# Patient Record
Sex: Male | Born: 1985
Health system: Southern US, Community
[De-identification: ages and names within clinical notes are randomized; demographics above are authoritative.]

## PROBLEM LIST (undated history)

## (undated) DIAGNOSIS — N46023 Azoospermia due to obstruction of efferent ducts: Secondary | ICD-10-CM

## (undated) DIAGNOSIS — I10 Essential (primary) hypertension: Secondary | ICD-10-CM

## (undated) DIAGNOSIS — K5090 Crohn's disease, unspecified, without complications: Secondary | ICD-10-CM

## (undated) DIAGNOSIS — R7989 Other specified abnormal findings of blood chemistry: Secondary | ICD-10-CM

## (undated) HISTORY — PX: CLUB FOOT RELEASE: SHX1363

## (undated) HISTORY — PX: UPPER GASTROINTESTINAL ENDOSCOPY: SHX188

## (undated) HISTORY — PX: COLONOSCOPY: SHX174

---
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
## 2006-10-09 ENCOUNTER — Emergency Department (HOSPITAL_COMMUNITY): Admission: EM | Admit: 2006-10-09 | Discharge: 2006-10-09 | Payer: Self-pay | Admitting: Emergency Medicine

## 2014-12-21 ENCOUNTER — Other Ambulatory Visit: Payer: Self-pay | Admitting: Urology

## 2014-12-25 ENCOUNTER — Other Ambulatory Visit: Payer: Self-pay | Admitting: Urology

## 2015-01-22 ENCOUNTER — Encounter (HOSPITAL_BASED_OUTPATIENT_CLINIC_OR_DEPARTMENT_OTHER): Payer: Self-pay | Admitting: *Deleted

## 2015-01-24 ENCOUNTER — Encounter (HOSPITAL_BASED_OUTPATIENT_CLINIC_OR_DEPARTMENT_OTHER): Payer: Self-pay | Admitting: *Deleted

## 2015-01-24 NOTE — Progress Notes (Signed)
NPO AFTER MN.  ARRIVE AT 0900.  GETTING LAB WORK DONE FRI 01-25-2015 (HgB, Hct, Testosterone level).  WILL TAKE AM MEDS W/ SIPS OF WATER DOS.

## 2015-01-28 ENCOUNTER — Ambulatory Visit (HOSPITAL_BASED_OUTPATIENT_CLINIC_OR_DEPARTMENT_OTHER): Payer: BLUE CROSS/BLUE SHIELD | Admitting: Anesthesiology

## 2015-01-28 ENCOUNTER — Encounter (HOSPITAL_BASED_OUTPATIENT_CLINIC_OR_DEPARTMENT_OTHER): Payer: Self-pay | Admitting: Anesthesiology

## 2015-01-28 ENCOUNTER — Encounter (HOSPITAL_BASED_OUTPATIENT_CLINIC_OR_DEPARTMENT_OTHER)
Admission: RE | Disposition: A | Discharge status: Home / Self Care | Payer: Self-pay | Source: Ambulatory Visit | Attending: Urology

## 2015-01-28 ENCOUNTER — Ambulatory Visit (HOSPITAL_BASED_OUTPATIENT_CLINIC_OR_DEPARTMENT_OTHER)
Admission: RE | Admit: 2015-01-28 | Discharge: 2015-01-28 | Disposition: A | Discharge status: Home / Self Care | Payer: BLUE CROSS/BLUE SHIELD | Source: Ambulatory Visit | Attending: Urology | Admitting: Urology

## 2015-01-28 DIAGNOSIS — N4601 Organic azoospermia: Secondary | ICD-10-CM | POA: Insufficient documentation

## 2015-01-28 DIAGNOSIS — E669 Obesity, unspecified: Secondary | ICD-10-CM | POA: Insufficient documentation

## 2015-01-28 DIAGNOSIS — E291 Testicular hypofunction: Secondary | ICD-10-CM | POA: Insufficient documentation

## 2015-01-28 DIAGNOSIS — N500 Atrophy of testis: Secondary | ICD-10-CM | POA: Insufficient documentation

## 2015-01-28 DIAGNOSIS — I861 Scrotal varices: Secondary | ICD-10-CM | POA: Diagnosis not present

## 2015-01-28 DIAGNOSIS — Z6830 Body mass index (BMI) 30.0-30.9, adult: Secondary | ICD-10-CM | POA: Insufficient documentation

## 2015-01-28 HISTORY — DX: Azoospermia due to obstruction of efferent ducts: N46.023

## 2015-01-28 HISTORY — PX: TESTICLE BIOPSY: SHX471

## 2015-01-28 LAB — POCT I-STAT 4, (NA,K, GLUC, HGB,HCT)
Glucose, Bld: 94 mg/dL (ref 65–99)
HCT: 43 % (ref 39.0–52.0)
Hemoglobin: 14.6 g/dL (ref 13.0–17.0)
Potassium: 4.4 mmol/L (ref 3.5–5.1)
Sodium: 140 mmol/L (ref 135–145)

## 2015-01-28 LAB — HEMOGLOBIN AND HEMATOCRIT, BLOOD
HCT: 41.3 % (ref 39.0–52.0)
Hemoglobin: 14.1 g/dL (ref 13.0–17.0)

## 2015-01-28 SURGERY — BIOPSY, TESTICLE
Anesthesia: General | Site: Scrotum | Laterality: Bilateral

## 2015-01-28 MED ORDER — ONDANSETRON HCL 4 MG/2ML IJ SOLN
INTRAMUSCULAR | Status: DC | PRN
  Administered 2015-01-28: 4 mg via INTRAVENOUS

## 2015-01-28 MED ORDER — LIDOCAINE HCL (CARDIAC) 20 MG/ML IV SOLN
INTRAVENOUS | Status: AC
Start: 2015-01-28 — End: 2015-01-28
  Filled 2015-01-28: qty 5

## 2015-01-28 MED ORDER — KETOROLAC TROMETHAMINE 30 MG/ML IJ SOLN
30.0000 mg | Freq: Once | INTRAMUSCULAR | Status: AC
  Administered 2015-01-28: 30 mg via INTRAVENOUS
  Filled 2015-01-28: qty 1

## 2015-01-28 MED ORDER — PROPOFOL 10 MG/ML IV BOLUS
INTRAVENOUS | Status: AC
  Filled 2015-01-28: qty 20

## 2015-01-28 MED ORDER — FENTANYL CITRATE (PF) 100 MCG/2ML IJ SOLN
25.0000 ug | INTRAMUSCULAR | Status: DC | PRN
Start: 2015-01-28 — End: 2015-01-28
  Administered 2015-01-28 (×2): 25 ug via INTRAVENOUS
  Filled 2015-01-28: qty 1

## 2015-01-28 MED ORDER — CIPROFLOXACIN IN D5W 400 MG/200ML IV SOLN
INTRAVENOUS | Status: AC
  Filled 2015-01-28: qty 200

## 2015-01-28 MED ORDER — PROPOFOL 10 MG/ML IV BOLUS
INTRAVENOUS | Status: DC | PRN
  Administered 2015-01-28: 200 mg via INTRAVENOUS

## 2015-01-28 MED ORDER — MIDAZOLAM HCL 2 MG/2ML IJ SOLN
INTRAMUSCULAR | Status: AC
  Filled 2015-01-28: qty 2

## 2015-01-28 MED ORDER — DEXAMETHASONE SODIUM PHOSPHATE 10 MG/ML IJ SOLN
INTRAMUSCULAR | Status: AC
  Filled 2015-01-28: qty 1

## 2015-01-28 MED ORDER — FENTANYL CITRATE (PF) 100 MCG/2ML IJ SOLN
INTRAMUSCULAR | Status: DC | PRN
  Administered 2015-01-28 (×2): 50 ug via INTRAVENOUS

## 2015-01-28 MED ORDER — OXYCODONE-ACETAMINOPHEN 5-325 MG PO TABS
1.0000 | ORAL_TABLET | ORAL | Status: DC | PRN
Start: 2015-01-28 — End: 2015-09-17

## 2015-01-28 MED ORDER — ONDANSETRON HCL 4 MG/2ML IJ SOLN
INTRAMUSCULAR | Status: AC
  Filled 2015-01-28: qty 2

## 2015-01-28 MED ORDER — LACTATED RINGERS IV SOLN
INTRAVENOUS | Status: DC
  Administered 2015-01-28 (×2): via INTRAVENOUS
  Filled 2015-01-28: qty 1000

## 2015-01-28 MED ORDER — SODIUM CHLORIDE 0.9 % IR SOLN
Status: DC | PRN
  Administered 2015-01-28: 500 mL

## 2015-01-28 MED ORDER — KETOROLAC TROMETHAMINE 30 MG/ML IJ SOLN
INTRAMUSCULAR | Status: AC
  Filled 2015-01-28: qty 1

## 2015-01-28 MED ORDER — FENTANYL CITRATE (PF) 100 MCG/2ML IJ SOLN
INTRAMUSCULAR | Status: AC
  Filled 2015-01-28: qty 2

## 2015-01-28 MED ORDER — CIPROFLOXACIN HCL 500 MG PO TABS: 500.0000 mg | ORAL_TABLET | Freq: Two times a day (BID) | ORAL | Status: DC

## 2015-01-28 MED ORDER — LIDOCAINE HCL (CARDIAC) 20 MG/ML IV SOLN
INTRAVENOUS | Status: DC | PRN
  Administered 2015-01-28: 80 mg via INTRAVENOUS

## 2015-01-28 MED ORDER — ACETAMINOPHEN 10 MG/ML IV SOLN
INTRAVENOUS | Status: AC
  Filled 2015-01-28: qty 100

## 2015-01-28 MED ORDER — ACETAMINOPHEN 10 MG/ML IV SOLN
1000.0000 mg | Freq: Once | INTRAVENOUS | Status: AC
  Administered 2015-01-28: 1000 mg via INTRAVENOUS
  Filled 2015-01-28: qty 100

## 2015-01-28 MED ORDER — OXYCODONE HCL 5 MG PO TABS
ORAL_TABLET | ORAL | Status: AC
  Filled 2015-01-28: qty 1

## 2015-01-28 MED ORDER — OXYCODONE HCL 5 MG PO TABS
5.0000 mg | ORAL_TABLET | Freq: Once | ORAL | Status: AC
  Administered 2015-01-28: 5 mg via ORAL
  Filled 2015-01-28: qty 1

## 2015-01-28 MED ORDER — MIDAZOLAM HCL 5 MG/5ML IJ SOLN
INTRAMUSCULAR | Status: DC | PRN
  Administered 2015-01-28: 2 mg via INTRAVENOUS

## 2015-01-28 MED ORDER — BUPIVACAINE HCL 0.25 % IJ SOLN
INTRAMUSCULAR | Status: DC | PRN
  Administered 2015-01-28: 6 mL

## 2015-01-28 MED ORDER — BUPIVACAINE HCL (PF) 0.25 % IJ SOLN
INTRAMUSCULAR | Status: AC
  Filled 2015-01-28: qty 30

## 2015-01-28 MED ORDER — DEXAMETHASONE SODIUM PHOSPHATE 4 MG/ML IJ SOLN
INTRAMUSCULAR | Status: DC | PRN
  Administered 2015-01-28: 10 mg via INTRAVENOUS

## 2015-01-28 MED ORDER — PROMETHAZINE HCL 25 MG/ML IJ SOLN
6.2500 mg | INTRAMUSCULAR | Status: DC | PRN
Start: 2015-01-28 — End: 2015-01-28
  Filled 2015-01-28: qty 1

## 2015-01-28 MED ORDER — CIPROFLOXACIN IN D5W 400 MG/200ML IV SOLN
400.0000 mg | INTRAVENOUS | Status: AC
  Administered 2015-01-28: 400 mg via INTRAVENOUS
  Filled 2015-01-28: qty 200

## 2015-01-28 SURGICAL SUPPLY — 36 items
BANDAGE CO FLEX L/F 2IN X 5YD (GAUZE/BANDAGES/DRESSINGS) ×2 IMPLANT
BLADE CLIPPER SURG (BLADE) IMPLANT
BLADE SURG 15 STRL LF DISP TIS (BLADE) ×1 IMPLANT
BLADE SURG 15 STRL SS (BLADE) ×2
BNDG GAUZE ELAST 4 BULKY (GAUZE/BANDAGES/DRESSINGS) ×1 IMPLANT
CLOTH BEACON ORANGE TIMEOUT ST (SAFETY) ×2 IMPLANT
COVER BACK TABLE 60X90IN (DRAPES) ×2 IMPLANT
COVER MAYO STAND STRL (DRAPES) ×2 IMPLANT
DRAPE PED LAPAROTOMY (DRAPES) ×2 IMPLANT
ELECT NDL TIP 2.8 STRL (NEEDLE) ×1 IMPLANT
ELECT NEEDLE TIP 2.8 STRL (NEEDLE) ×2 IMPLANT
ELECT REM PT RETURN 9FT ADLT (ELECTROSURGICAL) ×2
ELECTRODE REM PT RTRN 9FT ADLT (ELECTROSURGICAL) ×1 IMPLANT
GLOVE BIO SURGEON STRL SZ 6.5 (GLOVE) ×1 IMPLANT
GLOVE BIO SURGEON STRL SZ7.5 (GLOVE) ×2 IMPLANT
GLOVE INDICATOR 6.5 STRL GRN (GLOVE) ×1 IMPLANT
GLOVE INDICATOR 7.5 STRL GRN (GLOVE) ×1 IMPLANT
GOWN STRL REUS W/ TWL LRG LVL3 (GOWN DISPOSABLE) ×1 IMPLANT
GOWN STRL REUS W/ TWL XL LVL3 (GOWN DISPOSABLE) ×1 IMPLANT
GOWN STRL REUS W/TWL LRG LVL3 (GOWN DISPOSABLE) ×4
GOWN STRL REUS W/TWL XL LVL3 (GOWN DISPOSABLE) ×2
KIT ROOM TURNOVER WOR (KITS) ×2 IMPLANT
LIQUID BAND (GAUZE/BANDAGES/DRESSINGS) ×1 IMPLANT
MANIFOLD NEPTUNE II (INSTRUMENTS) IMPLANT
NDL HYPO 25X5/8 SAFETYGLIDE (NEEDLE) ×1 IMPLANT
NEEDLE HYPO 25X5/8 SAFETYGLIDE (NEEDLE) ×2 IMPLANT
NS IRRIG 500ML POUR BTL (IV SOLUTION) ×2 IMPLANT
PACK BASIN DAY SURGERY FS (CUSTOM PROCEDURE TRAY) ×2 IMPLANT
PENCIL BUTTON HOLSTER BLD 10FT (ELECTRODE) ×2 IMPLANT
SPONGE GAUZE 4X4 12PLY STER LF (GAUZE/BANDAGES/DRESSINGS) ×1 IMPLANT
SUT MON AB 4-0 PC3 18 (SUTURE) ×7 IMPLANT
SYR CONTROL 10ML LL (SYRINGE) ×2 IMPLANT
TOWEL OR 17X24 6PK STRL BLUE (TOWEL DISPOSABLE) ×4 IMPLANT
TRAY DSU PREP LF (CUSTOM PROCEDURE TRAY) ×2 IMPLANT
TUBE CONNECTING 12X1/4 (SUCTIONS) IMPLANT
WATER STERILE IRR 500ML POUR (IV SOLUTION) ×2 IMPLANT

## 2015-01-28 NOTE — Anesthesia Preprocedure Evaluation (Addendum)
Anesthesia Evaluation  Patient identified by MRN, date of birth, ID band Patient awake    Reviewed: Allergy & Precautions, NPO status , Patient's Chart, lab work & pertinent test results  Airway Mallampati: II  TM Distance: >3 FB Neck ROM: Full    Dental no notable dental hx.    Pulmonary neg pulmonary ROS,    Pulmonary exam normal breath sounds clear to auscultation       Cardiovascular negative cardio ROS Normal cardiovascular exam Rhythm:Regular Rate:Normal     Neuro/Psych negative neurological ROS  negative psych ROS   GI/Hepatic negative GI ROS, Neg liver ROS,   Endo/Other  negative endocrine ROS  Renal/GU negative Renal ROS  negative genitourinary   Musculoskeletal negative musculoskeletal ROS (+)   Abdominal (+) + obese,   Peds negative pediatric ROS (+)  Hematology negative hematology ROS (+)   Anesthesia Other Findings   Reproductive/Obstetrics negative OB ROS                             Anesthesia Physical Anesthesia Plan  ASA: II  Anesthesia Plan: General   Post-op Pain Management:    Induction: Intravenous  Airway Management Planned: LMA  Additional Equipment:   Intra-op Plan:   Post-operative Plan: Extubation in OR  Informed Consent: I have reviewed the patients History and Physical, chart, labs and discussed the procedure including the risks, benefits and alternatives for the proposed anesthesia with the patient or authorized representative who has indicated his/her understanding and acceptance.   Dental advisory given  Plan Discussed with: CRNA  Anesthesia Plan Comments:         Anesthesia Quick Evaluation  

## 2015-01-28 NOTE — Anesthesia Postprocedure Evaluation (Signed)
Anesthesia Post Note  Patient: Christopher Nguyen  Procedure(s) Performed: Procedure(s) (LRB): BIOPSY TESTICULAR (Bilateral)  Patient location during evaluation: PACU Anesthesia Type: General Level of consciousness: awake and alert Pain management: pain level controlled Vital Signs Assessment: post-procedure vital signs reviewed and stable Respiratory status: spontaneous breathing, nonlabored ventilation, respiratory function stable and patient connected to nasal cannula oxygen Cardiovascular status: blood pressure returned to baseline and stable Postop Assessment: no signs of nausea or vomiting Anesthetic complications: no    Last Vitals:  Filed Vitals:   01/28/15 1245 01/28/15 1300  BP: 144/108 152/103  Pulse: 80 94  Temp:    Resp: 12 15    Last Pain:  Filed Vitals:   01/28/15 1402  PainSc: 3                  Beth Goodlin J

## 2015-01-28 NOTE — Discharge Instructions (Addendum)
Infertility Infertility is when you are unable to get pregnant (conceive) after a year of having sex regularly without using birth control. Infertility can also mean that a woman is not able to carry a pregnancy to full term.  Both women and men can have fertility problems. WHAT CAUSES INFERTILITY? What Causes Infertility in Women? There are many possible causes of infertility in women. For some women, the cause of infertility is not known (unexplained infertility). Infertility can also be linked to more than one cause. Infertility problems in women can be caused by problems with the menstrual cycle or reproductive organs, certain medical conditions, and factors related to lifestyle and age.  Problems with your menstrual cycle can interfere with your ovaries producing eggs (ovulation). This can make it difficult to get pregnant. This includes having a menstrual cycle that is very long, very short, or irregular.  Problems with reproductive organs can include:  An abnormally narrow cervix or a cervix that does not remain closed during a pregnancy.  A blockage in your fallopian tubes.  An abnormally shaped uterus.  Uterine fibroids. This is a tissue mass (tumor) that can develop on your uterus.  Medical conditions that can affect a woman's fertility include:  Polycystic ovarian syndrome (PCOS). This is a hormonal disorder that can cause small cysts to grow on your ovaries. This is the most common cause of infertility in women.  Endometriosis. This is a condition in which the tissue that lines your uterus (endometrium) grows outside of its normal location.  Primary ovary insufficiency. This is when your ovaries stop producing eggs and hormones before the age of 72.  Sexually transmitted diseases, such as chlamydia or gonorrhea. These infections can cause scarring in your fallopian tubes. This makes it difficult for eggs to reach your uterus.  Autoimmune disorders. These are disorders in  which your immune system attacks normal, healthy cells.  Hormone imbalances.  Other factors include:  Age. A woman's fertility declines with age, especially after her mid-68s.  Being under- or overweight.  Drinking too much alcohol.  Using drugs.  Exercising excessively.  Being exposed to environmental toxins, such as radiation, pesticides, and certain chemicals. What Causes Infertility in Men? There are many causes of infertility in men. Infertility can be linked to more than one cause. Infertility problems in men can be caused by problems with sperm or the reproductive organs, certain medical conditions, and factors related to lifestyle and age. Some men have unexplained infertility.   Problems with sperm. Infertility can result if there is a problem producing:  Enough sperm (low sperm count).  Enough normally-shaped sperm (sperm morphology).  Sperm that are able to reach the egg (poor motility).  Infertility can also be caused by:  A problem with hormones.  Enlarged veins (varicoceles), cysts (spermatoceles), or tumors of the testicles.  Sexual dysfunction.  Injury to the testicles.  A birth defect, such as not having the tubes that carry sperm (vas deferens).  Medical conditions that can affect a man's fertility include:  Diabetes.  Cancer treatments, such as chemotherapy or radiation.  Klinefelter syndrome. This is an inherited genetic disorder.   Thyroid problems, such as an under- or overactive thyroid.  Cystic fibrosis.  Sexually transmitted diseases.  Other factors include:  Age. A man's fertility declines with age.  Drinking too much alcohol.  Using drugs.  Being exposed to environmental toxins, such as pesticides and lead. WHAT ARE THE SYMPTOMS OF INFERTILITY? Being unable to get pregnant after one year of having  regular sex without using birth control is the only sign of infertility.  HOW IS INFERTILITY DIAGNOSED? In order to be diagnosed  with infertility, both partners will have a physical exam. Both partners will also have an extensive medical and sexual history taken. If there is no obvious reason for infertility, additional tests may be done. What Tests Will Women Have? Women may first have tests to check whether they are ovulating each month. The tests may include:  Blood tests to check hormone levels.  An ultrasound of the ovaries. This looks for possible problems on or in the ovaries.  Taking a small sample of the tissue that lines the uterus for examination under a microscope (endometrial biopsy). Women who are ovulating may have additional tests. These may include:  Hysterosalpingography.  This is an X-ray of the fallopian tubes and uterus taken after a specific type of dye is injected.  This test can show the shape of the uterus and whether the fallopian tubes are open.  Laparoscopy.  In this test, a lighted tube (laparoscope) is used to look for problems in the fallopian tubes and other male organs.  Transvaginal ultrasound.  This is an imaging test to check for abnormalities of the uterus and ovaries.  A health care provider can use this test to count the number of follicles on the ovaries.  Hysteroscopy.  This test involves using a lighted tube to examine the cervix and inside the uterus.  It is done to find any abnormalities inside the uterus. What Tests Will Men Have? Tests for men's infertility includes:  Semen tests to check sperm count, morphology, and motility.  Blood tests to check for hormone levels.  Taking a small sample of tissue from inside a testicle (biopsy). This is examined under a microscope.  Blood tests to check for genetic abnormalities (genetic testing). HOW ARE WOMEN TREATED FOR INFERTILITY?  Treatment depends on the cause of infertility. Most cases of infertility in women are treated with medicine or surgery.  Women may take medicine to:  Correct ovulation  problems.  Treat other health conditions, such as PCOS.  Surgery may be done to:  Repair damage to the ovaries, fallopian tubes, cervix, or uterus.  Remove growths from the uterus.  Remove scar tissue from the uterus, pelvis, or other male organs. HOW ARE MEN TREATED FOR INFERTILITY?  Treatment depends on the cause of infertility. Most cases of infertility in men are treated with medicine or surgery.   Men may take medicine to:  Correct hormone problems.  Treat other health conditions.  Treat sexual dysfunction.  Surgery may be done to:  Remove blockages in the reproductive tract.  Correct other structural problems of the reproductive tract. WHAT IS ASSISTED REPRODUCTIVE TECHNOLOGY? Assisted reproductive technology (ART) refers to all treatments and procedures that combine eggs and sperm outside the body to try to help a couple conceive. ART is often combined with fertility drugs to stimulate ovulation. Sometimes ART is done using eggs retrieved from another woman's body (donor eggs) or from previously frozen fertilized eggs (embryos).  There are different types of ART. These include:   Intrauterine insemination (IUI).  In this procedure, sperm is placed directly into a woman's uterus with a long, thin tube.  This may be most effective for infertility caused by sperm problems, including low sperm count and low motility.  Can be used in combination with fertility drugs.  In vitro fertilization (IVF).  This is often done when a woman's fallopian tubes are blocked  or when a man has low sperm counts.  Fertility drugs stimulate the ovaries to produce multiple eggs. Once mature, these eggs are removed from the body and combined with the sperm to be fertilized.  These fertilized eggs are then placed in the woman's uterus.   This information is not intended to replace advice given to you by your health care provider. Make sure you discuss any questions you have with your  health care provider.     HOME CARE INSTRUCTIONS FOR SCROTAL PROCEDURES  Wound Care & Hygiene: You may apply an ice bag to the scrotum for the first 24 hours.  This may help decrease swelling and soreness.  You may have a dressing held in place by an athletic supporter.  You may remove the dressing in 24 hours and shower in 48 hours.  Continue to use the athletic supporter or tight briefs for at least a week. Activity: Rest today - not necessarily flat bed rest.  Just take it easy.  You should not do strenuous activities until your follow-up visit with your doctor.  You may resume light activity in 48 hours.  Return to Work:  Your doctor will advise you of this depending on the type of work you do  Diet: Drink liquids or eat a light diet this evening.  You may resume a regular diet tomorrow.  General Expectations: You may have a small amount of bleeding.  The scrotum may be swollen or bruised for about a week.  Call your Doctor if these occur:  -persistent or heavy bleeding  -temperature of 101 degrees or more  -severe pain, not relieved by your pain medication  Return to [employer]:  Call to set up and appointment.    Post Anesthesia Home Care Instructions  Activity: Get plenty of rest for the remainder of the day. A responsible adult should stay with you for 24 hours following the procedure.  For the next 24 hours, DO NOT: -Drive a car -Paediatric nurse -Drink alcoholic beverages -Take any medication unless instructed by your physician -Make any legal decisions or sign important papers.  Meals: Start with liquid foods such as gelatin or soup. Progress to regular foods as tolerated. Avoid greasy, spicy, heavy foods. If nausea and/or vomiting occur, drink only clear liquids until the nausea and/or vomiting subsides. Call your physician if vomiting continues.  Special Instructions/Symptoms: Your throat may feel dry or sore from the anesthesia or the breathing tube  placed in your throat during surgery. If this causes discomfort, gargle with warm salt water. The discomfort should disappear within 24 hours.  If you had a scopolamine patch placed behind your ear for the management of post- operative nausea and/or vomiting:  1. The medication in the patch is effective for 72 hours, after which it should be removed.  Wrap patch in a tissue and discard in the trash. Wash hands thoroughly with soap and water. 2. You may remove the patch earlier than 72 hours if you experience unpleasant side effects which may include dry mouth, dizziness or visual disturbances. 3. Avoid touching the patch. Wash your hands with soap and water after contact with the patch.

## 2015-01-28 NOTE — Anesthesia Procedure Notes (Signed)
Procedure Name: LMA Insertion Date/Time: 01/28/2015 11:22 AM Performed by: Bethena Roys T Pre-anesthesia Checklist: Patient identified, Emergency Drugs available, Suction available and Patient being monitored Patient Re-evaluated:Patient Re-evaluated prior to inductionOxygen Delivery Method: Circle System Utilized Preoxygenation: Pre-oxygenation with 100% oxygen Intubation Type: IV induction Ventilation: Mask ventilation without difficulty LMA Size: 4.0 Number of attempts: 1 Airway Equipment and Method: Bite block Placement Confirmation: positive ETCO2 Dental Injury: Teeth and Oropharynx as per pre-operative assessment

## 2015-01-28 NOTE — Op Note (Signed)
Pre-operative diagnosis :   Azoospermia  Postoperative diagnosis: Same  Operation: Bilateral testicle biopsy    Surgeon:  S. Gaynelle Arabian, MD  First assistant:  None   Intraoperative reproductive biologist: Neldon Labella, MD  Anesthesia: Gen. LMA  Preparation:  After appropriate preanesthesia, the patient was brought Her room, placed on the operating table in the dorsal supine position where general LMA anesthesia was introduced. He remain in this position, where the pubis and penis and scrotum were prepped with Betadine solution and draped in usual fashion. History was reviewed.  Review history:  Problems  1. Atrophy of both testes (N50.0) 2. 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 Hypogonadism, testicular (E29.1) 4. Male infertility (N46.9) 5. Varicocele (I86.1)  History of Present Illness    29 yo male, married to Spofford, DOB 03/01/87, returns today to have a scrotal u/s & to review lab results. Hx of infertility. He has had a previous abnormal semen analysis. He has noted decrease in size of testes over 3-4 yrs, with more clear, low watery ejaculate, decrease in libido and energy level. He has been married for 2 3/4 yrs.  Testosterone has responded to clomiphene citrate, 50mg  po, per latest lab from dr. Kerin Perna.   Statement of  Likelihood of Success: Excellent. TIME-OUT observed.:  Procedure:  Evaluation scrotum revealed that the testicles were indeed small to palpation. The testicles measured approximately 2 cm x 1.5 cm and appeared to be somewhat soft under examination under anesthesia. The testicles were descended bilaterally. The scrotum was normal, with normal rotations. The midline appeared to be normal. The penis itself was normal, and circumcised. The urethra was normal.  A 3 cm midline incision is made in the midline of the scrotum, and subcutaneous tissue dissected with electrosurgical unit. The left hemiscrotum was entered, and the left S1 identified. The tunica  vaginalis was entered, and the tunica albuginea was identified. The tunica albuginea was then entered, and a copious amount of tissue was delivered in the wound, and biopsy of the left testicle was accomplished. Electrosurgical unit was used to electrocoagulate bleeding. The tissue was evaluated by the reproductive biologist in the operating room for appropriateness of tissue volume. Following this, 4-0 Monocryl suture was used to close the 17 m incision in the tunica albuginea. No bleeding was noted. The testicle was delivered into the left hemiscrotum, and the left hemiscrotum was closed with running 4-0 Monocryl suture.  The right hemiscrotum was then entered, and the right testicle was identified. The tunica vaginalis was entered, and the tunica albuginea was identified and incised. Again, tissues in the right testicle was removed, and evaluated by the reproductive biologist for appropriateness of volume. Bleeding was electrocoagulated. The right testicle was closed with 4-0 Monocryl suture.   The wound was then closed with running 4-0 Monocryl suture in 2 layers with the dartos layer and the skin layer. Sterile dressing was applied. The wound was injected with 0.25 plain Marcaine. The patient was awakened and taken to recovery room in good condition. Ice was placed on the wound.

## 2015-01-28 NOTE — Transfer of Care (Signed)
Immediate Anesthesia Transfer of Care Note  Patient: Christopher Nguyen  Procedure(s) Performed: Procedure(s): BIOPSY TESTICULAR (Bilateral)  Patient Location: PACU  Anesthesia Type:General  Level of Consciousness: awake, alert  and oriented  Airway & Oxygen Therapy: Patient Spontanous Breathing and Patient connected to nasal cannula oxygen  Post-op Assessment: Report given to RN  Post vital signs: Reviewed and stable  Last Vitals:  Filed Vitals:   01/28/15 0909  BP: 152/99  Pulse: 89  Temp: 37.4 C  Resp: 10    Complications: No apparent anesthesia complications

## 2015-01-28 NOTE — Interval H&P Note (Signed)
History and Physical Interval Note:  01/28/2015 11:18 AM  Christopher Nguyen  has presented today for surgery, with the diagnosis of OBSTRUCTIVE AZOOSPERMIA  The various methods of treatment have been discussed with the patient and family. After consideration of risks, benefits and other options for treatment, the patient has consented to  Procedure(s): BIOPSY TESTICULAR (Bilateral) as a surgical intervention .  The patient's history has been reviewed, patient examined, no change in status, stable for surgery.  I have reviewed the patient's chart and labs.  Questions were answered to the patient's satisfaction.     Skylar Flynt [place]

## 2015-01-28 NOTE — H&P (Signed)
Reason For Visit Scrotal u/s & review labs   Active Problems Problems  1. Atrophy of both testes (N50.0) 2. 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 Hypogonadism, testicular (E29.1) 4. Male infertility (N46.9) 5. Varicocele (I86.1)  History of Present Illness      29 yo male, married to [place], DOB 03/01/[birth year], returns today to have a scrotal u/s & to review lab results. Hx of infertility.  He has had a previous abnormal semen analysis. He has noted decrease in size of testes over 3-4 yrs, with more clear, low watery ejaculate, decrease in libido and energy level. He has been married for 2 3/4 yrs.    Past Medical History Problems  1. History of No significant past medical history  Surgical History Problems  1. History of Foot Surgery  Current Meds 1. Multiple Vitamin TABS;  Therapy: (Recorded:18May2016) to Recorded  Allergies Medication  1. Cephalosporins  Family History Problems  1. Family history of colon cancer (Z80.0) : Father 2. Family history of malignant neoplasm of urinary bladder (Z80.52) : Mother  Social History Problems  1. Alcohol use (Z78.9) 2. Married 3. Never a smoker 4. Occupation   Building services engineer  Review of Systems Genitourinary, constitutional, skin, eye, otolaryngeal, hematologic/lymphatic, cardiovascular, pulmonary, endocrine, musculoskeletal, gastrointestinal, neurological and psychiatric system(s) were reviewed and pertinent findings if present are noted and are otherwise negative.    Vitals Vital Signs [Data Includes: Last 1 Day]  Recorded: 02Aug2016 03:05PM  Height: 5 ft 4 in Weight: 178 lb  BMI Calculated: 30.55 BSA Calculated: 1.86 Blood Pressure: 164 / 84 Temperature: 98.6 F Heart Rate: 66  Physical Exam Constitutional: Well nourished and well developed . No acute distress.  ENT:. The ears and nose are normal in appearance.  Neck: The appearance of the neck is normal and no neck mass is present.  Pulmonary: No respiratory distress and  normal respiratory rhythm and effort.  Cardiovascular: Heart rate and rhythm are normal . No peripheral edema.  Abdomen: The abdomen is soft and nontender. No masses are palpated. No CVA tenderness. No hernias are palpable. No hepatosplenomegaly noted.  Genitourinary: Examination of the penis demonstrates no swelling, no tenderness, no discharge, no adherence of the prepuce and no lesions. The penis is uncircumcised. The scrotum is normal in appearance. Examination of the right scrotum demonstrates no hydrocele and no varicocele. Examination of the left scrotum demostrates no hydrocele and no varicocele. The right vas deferens is is palpably normal. The left vas deferens is palpably normal. The right epididymis is without a spermatocele. The left epididymis is without a spermatocele. The right testis is atrophic. The left testis is atrophic.  Lymphatics: The femoral and inguinal nodes are not enlarged or tender.  Skin: Normal skin turgor, no visible rash and no visible skin lesions.  Neuro/Psych:. Mood and affect are appropriate.    Results/Data Urine [Data Includes: Last 1 Day]   02Aug2016  COLOR YELLOW   APPEARANCE CLEAR   SPECIFIC GRAVITY 1.010   pH 6.0   GLUCOSE NEGATIVE   BILIRUBIN NEGATIVE   KETONE NEGATIVE   BLOOD NEGATIVE   PROTEIN NEGATIVE   NITRITE NEGATIVE   LEUKOCYTE ESTERASE NEGATIVE   Selected Results  UA With REFLEX 02Aug2016 01:51PM Carolan Clines  SPECIMEN TYPE: CLEAN CATCH   Test Name Result Flag Reference  COLOR YELLOW  YELLOW  ** PLEASE NOTE CHANGE IN UNIT OF MEASURE AND REFERENCE RANGE(S). **  APPEARANCE CLEAR  CLEAR  SPECIFIC GRAVITY 1.010  1.001-1.035  pH 6.0  5.0-8.0  GLUCOSE NEGATIVE  NEGATIVE  BILIRUBIN NEGATIVE  NEGATIVE  KETONE NEGATIVE  NEGATIVE  BLOOD NEGATIVE  NEGATIVE  PROTEIN NEGATIVE  NEGATIVE  NITRITE NEGATIVE  NEGATIVE  LEUKOCYTE ESTERASE NEGATIVE  NEGATIVE   [place] [facility] [place] [id number] 04:03PM Carolan Clines  SPECIMEN TYPE: BLOOD   Test  Name Result Flag Reference  FSH 9.2 mIU/mL  1.4-18.1  REFERENCE RANGES:          MALE:                         1.4 -  18.1 MIU/ML          MALE:   FOLLICULAR PHASE    2.5 -  10.2 MIU/ML                    MIDCYCLE PEAK       3.4 -  33.4 MIU/ML                    LUTEAL PHASE        1.5 -   9.1 MIU/ML                    POST MENOPAUSAL    23.0 - 116.3 MIU/ML                    PREGNANT                <   0.3 MIU/ML  LH 4.8 mIU/mL  1.5-9.3  REFERENCE RANGES:          MALE:     20 - 70 YEARS           1.5 -  9.3 MIU/ML                       > 70 YEARS           3.1 - 34.6 MIU/ML          MALE:   FOLLICULAR PHASE        1.9 - 12.5 MIU/ML                    MIDCYCLE                8.7 - 76.3 MIU/ML                    LUTEAL PHASE            0.5 - 16.9 MIU/ML                    POST MENOPAUSAL        15.9 - 54.0 MIU/ML                    PREGNANT                    <  1.5 MIU/ML                    CONTRACEPTIVES          0.7 -  5.6 MIU/ML          CHILDREN:                             <  6.0 MIU/ML   PROLACTIN [id number] 04:03PM Carolan Clines  SPECIMEN TYPE: BLOOD  Test Name Result Flag Reference  PROLACTIN 8.1 ng/mL  2.1-17.1  REFERENCE RANGES:                  MALE:                       2.1 -  17.1 NG/ML                  MALE:   PREGNANT          9.7 - 208.5 NG/ML                            NON PREGNANT      2.8 -  29.2 NG/ML                            POST MENOPAUSAL   1.8 -  20.3 NG/ML   TESTOSTERONE PANEL [id number] 04:03PM Carolan Clines  SPECIMEN TYPE: BLOOD   Test Name Result Flag Reference  TESTOSTERONE, TOTAL 169 ng/dL L 300-890  TANNER STAGE       MALE              MALE               I              < 30 NG/DL        < 10 NG/DL               II             < 150 NG/DL       < 30 NG/DL               III            100-320 NG/DL     < 35 NG/DL               IV             200-970 NG/DL     15-40 NG/DL               V/ADULT        300-890 NG/DL     10-70  NG/DL  SEX HORMONE BINDING GLOBULIN 11 nmol/L  10-50  TESTOSTERONE, FREE 51.5 pg/mL  47.0-244.0  THE CONCENTRATION OF FREE TESTOSTERONE IS DERIVED FROM A MATHEMATICAL EXPRESSION BASED ON CONSTANTS FOR THE BINDING OF TESTOSTERONE TO SEX HORMONE-BINDING GLOBULIN AND ALBUMIN.  TESTOSTERONE, %FREE 3.0 % H 1.6-2.9   THYROID PANEL with TSH [id number] 04:03PM Carolan Clines  SPECIMEN TYPE: BLOOD   Test Name Result Flag Reference  T4 8.0 ug/dL  4.5-12.0  T3 Uptake 29 %  22-35  Free Thyroxine Index 2.3  1.4-3.8  TSH 1.601 uIU/mL  0.350-4.500   CHROMOSOME ANALYSIS, PERIPHERAL BLD [id number] 04:03PM Carolan Clines  SPECIMEN TYPE: BLOOD   Test Name Result Flag Reference  Chromosome Analysis, Blood REPORT    CYTOGENETIC RESULTS CYTOGENETIC REFERENCE #: [id number] TEST SETUP DATE: 07/06/2014 TEST COMPLETION DATE: 07/18/2014 SPECIMEN SOURCE: PERIPHERAL BLOOD CLINICAL HISTORY:NOT PROVIDED CULTURE TYPE:PHA STIMULATED WHOLE BLOOD METAPHASES COUNTED:20     ANALYZED:5     KARYOTYPED:2 BANDING LEVEL (G-BANDS):>=550 KARYOTYPE: 46,XY INTERPRETATION AND COMMENTS: NORMAL MALE KARYOTYPE WITHIN THE LIMITS OF STANDARD CYTOGENETIC METHODOLOGIES, THE CHROMOSOMES HAD NORMAL G-BANDING PATTERNS WITHOUT APPARENT STRUCTURAL ABNORMALITY OR REARRANGEMENT. THIS TEST DOES NOT ADDRESS GENETIC DISORDERS THAT  CANNOT BE DETECTED BY STANDARD CYTOGENETIC METHODS, OR RARE EVENTS SUCH AS LOW LEVEL MOSAICISM OR VERY SUBTLE REARRANGEMENTS. ELECTRONIC SIGNATURE ON FILE ____________________________ Wynelle Beckmann, PH.D., [facility], [place], [phone]  Results received 07/18/14    REFERENCE LAB ACCESSION: [id number] EC FOR MORE INFORMATION ON THIS TEST, GO TO [email or web address]   Assessment Assessed  1. Male infertility (N46.9) 2. 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 Atrophy of both testes (N50.0) 4. Hypogonadism, testicular (E29.1) 5. Varicocele (I86.1)  We  have discussed the pt's azoospermia. [name] the couple will have difficulty with marriage and communication.   Plan Atrophy of both testes, Hypogonadism, testicular  1. HEMOGLOBIN & HEMATOCRIT; Status:Hold For - Specimen/Data Collection,Appointment;  Requested [id number] next appointment;  2. TESTOSTERONE; Status:Hold For - Exact Date,Specimen/Data Collection,Appointment;  Requested [id number] next appointment;  Azoospermia, Hypogonadism, testicular, Male infertility, Varicocele  3. Follow-up Month x 4 Office  Follow-up  Status: Hold For - Appointment,Date of Service   Requested for: 02Aug2016 Health Maintenance  4. UA With REFLEX; [Do Not Release]; Status:Resulted - Requires Verification;   [id number] 01:51PM Hypogonadism, testicular  5. Start: ClomiPHENE Citrate 50 MG Oral Tablet; Take 1 tablet daily 6. Miscellaneous Referral Referral  Referral: Dr. Neldon Labella  Status: Hold For -  Appointment,Records  Requested for: 02Aug2016  1, 2nd opinion with Neldon Labella, MD   2. Clomid for hypogonadism.   Discussion/Summary cc: Neldon Labella, MD   Aggie Moats PA, [facility]: Laurin Hole     Amendment  Scrotal ultrasound shows testicles descended bilaterally. The right testicle measures 2.9 x 1.5 x 2.18 cm and the left testicle measures 2.79 x 1.22 x 2.447 years. There is bilateral testicle microlithiasis. There is a left varicocele by definition, measuring 0.36 cm with Valsalva maneuver. (Normal less than 0.30 cm and (.    Distinct abnormal scrotal ultrasound as discussed with the patient.1     1 Amended By: Carolan Clines; Sep 18 2014 5:54 PM EST  Signatures   Case discussed with Dr. Kerin Perna and pt. He is responding to testis stimulation for testosterone, and will have testis bx today to see if he has any sperm precursors present.

## 2015-01-29 ENCOUNTER — Encounter (HOSPITAL_BASED_OUTPATIENT_CLINIC_OR_DEPARTMENT_OTHER): Payer: Self-pay | Admitting: Urology

## 2015-01-29 LAB — TESTOSTERONE: Testosterone: 718 ng/dL (ref 348–1197)

## 2015-05-20 DIAGNOSIS — F4321 Adjustment disorder with depressed mood: Secondary | ICD-10-CM | POA: Diagnosis not present

## 2015-09-17 ENCOUNTER — Encounter: Payer: Self-pay | Admitting: Behavioral Health

## 2015-09-17 ENCOUNTER — Telehealth: Payer: Self-pay | Admitting: Behavioral Health

## 2015-09-17 NOTE — Telephone Encounter (Signed)
Pre-Visit Call completed with patient and chart updated.   Pre-Visit Info documented in Specialty Comments under [employer].    

## 2015-09-18 ENCOUNTER — Encounter: Payer: Self-pay | Admitting: Family Medicine

## 2015-09-18 ENCOUNTER — Ambulatory Visit (INDEPENDENT_AMBULATORY_CARE_PROVIDER_SITE_OTHER): Payer: BLUE CROSS/BLUE SHIELD | Admitting: Family Medicine

## 2015-09-18 VITALS — BP 152/98 | HR 87 | Temp 98.2°F | Ht 62.0 in | Wt 182.0 lb

## 2015-09-18 DIAGNOSIS — Z131 Encounter for screening for diabetes mellitus: Secondary | ICD-10-CM | POA: Diagnosis not present

## 2015-09-18 DIAGNOSIS — Z13220 Encounter for screening for lipoid disorders: Secondary | ICD-10-CM

## 2015-09-18 DIAGNOSIS — E663 Overweight: Secondary | ICD-10-CM

## 2015-09-18 DIAGNOSIS — Z5181 Encounter for therapeutic drug level monitoring: Secondary | ICD-10-CM | POA: Diagnosis not present

## 2015-09-18 DIAGNOSIS — I10 Essential (primary) hypertension: Secondary | ICD-10-CM | POA: Diagnosis not present

## 2015-09-18 MED ORDER — LISINOPRIL 10 MG PO TABS: 10.0000 mg | ORAL_TABLET | Freq: Every day | ORAL | 3 refills | Status: DC

## 2015-09-18 NOTE — Patient Instructions (Signed)
It was great to meet you today!   Start on lisinopril 5- 10 mg a day for your blood pressure.  Continue your exercise program.  Please let me know how your readings look over the next month or so.

## 2015-09-18 NOTE — Progress Notes (Signed)
Christopher Nguyen at [facility] [address] [place], [place] [address] [phone]  Date:  09/18/2015   Name:  Christopher Nguyen   DOB:  [birth year]   MRN:  [id number]  PCP:  Lamar Blinks, MD    Chief Complaint: Establish Care (c/o having high bp readings in the past around 140/85. Pt has fam hx of hypertension. Would also like to discuss weight recently started excerise program with [facility].  Pt is due for tetanus but would like to wait to update. )   History of Present Illness:  Christopher Nguyen is a 30 y.o. very pleasant male patient who presents with the following:  New pt here today to establish care and also to discuss blood pressure.   Never a smoker  He works at [employer] in the [employer] He has not seen a doctor in several years.   His brother and dad have HTN.  He has noted borderline BP for a few years.    He does see Dr. Gaynelle Arabian; he is on clomid.  They are using this for testosterone support/ fertility.  Dr. Darene Lamer checks his testosterone levels for him.  He and his wife are due in February with their first child!  So far the pregnancy is going well  He does cardio 30 min, 4x a week- He is part of a study at [facility] that is studying the effects of exercise. As part of this study they check his BP several times and it has been 140-150/ 90-95 on a consistent basis.   No CP with exercise.  He may sometimes feel SOB when he is running on the treadmill. No SOB with regular every day exertion such as carrying groceries, etc  No recent cholesterol test that he can recall, or blood sugar reading He ate about 3 hours ago He is a non smoker and is otherwise in good health  No fever, chills, belly pain, nausea or vomiting There are no active problems to display for this patient.   Past Medical History:  Diagnosis Date  . 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 CLUB FOOT RELEASE  infant  .  TESTICLE BIOPSY Bilateral 01/28/2015   Procedure: BIOPSY TESTICULAR;  Surgeon: Carolan Clines, MD;  Location: [facility];  Service: Urology;  Laterality: Bilateral;    Social History  Substance Use Topics  . Smoking status: Never Smoker  . Smokeless tobacco: Never Used  . Alcohol use 1.8 oz/week    3 Shots of liquor per week    Family History  Problem Relation Age of Onset  . Bladder Cancer Mother   . Ovarian cancer Mother   . Colon cancer Father   . Hypertension Father   . Hypertension Brother   . Melanoma Brother   . Alcoholism Maternal Grandmother   . Alcohol abuse Maternal Grandfather   . Alcoholism Maternal Grandfather     Allergies  Allergen Reactions  . Cephalosporins Other (See Comments)    Unknown childhood reaction    Medication list has been reviewed and updated.  Current Outpatient Prescriptions on File Prior to Visit  Medication Sig Dispense Refill  . buPROPion (WELLBUTRIN XL) 150 MG 24 hr tablet Take 150 mg by mouth every morning.    . clomiPHENE (CLOMID) 50 MG tablet Take 50 mg by mouth every morning.    . Multiple Vitamin (MULTIVITAMIN) tablet Take 1 tablet by  mouth daily.     No current facility-administered medications on file prior to visit.     Review of Systems:  As per HPI- otherwise negative.   Physical Examination: Vitals:   09/18/15 1515 09/18/15 1523  BP: (!) 152/98 (!) 152/98  Pulse: 87   Temp: 98.2 F (36.8 C)    Vitals:   09/18/15 1515  Weight: 182 lb (82.6 kg)  Height: 5\' 2"  (1.575 m)   Body mass index is 33.29 kg/m. Ideal Body Weight: Weight in (lb) to have BMI = 25: 136.4  GEN: WDWN, NAD, Non-toxic, A & O x 3, overweight, looks well HEENT: Atraumatic, Normocephalic. Neck supple. No masses, No LAD. Ears and Nose: No external deformity. CV: RRR, No M/G/R. No JVD. No thrill. No extra heart sounds. PULM: CTA B, no wheezes, crackles, rhonchi. No retractions. No resp. distress. No accessory muscle  use. ABD: S, NT, ND EXTR: No c/c/e NEURO Normal gait.  PSYCH: Normally interactive. Conversant. Not depressed or anxious appearing.  Calm demeanor.   EKG: NSR, no concerning features  Assessment and Plan: Essential hypertension, benign - Plan: EKG 12-Lead, lisinopril (PRINIVIL,ZESTRIL) 10 MG tablet  Screening for diabetes mellitus - Plan: Comprehensive metabolic panel, Hemoglobin A1c  Screening for lipid disorders - Plan: LDL cholesterol, direct  Medication monitoring encounter - Plan: CBC  Overweight  Here today as a new patient to discuss concern about BP and overweight Encouraged him to continue exercise Start on lisinopril 5mg - if he is able to reach approx 130/85 on this dose fine, otherwise he will increase to 10 mg Check labs as above Plan to visit in a few months- will make plans pending labs   Signed Lamar Blinks, MD

## 2015-09-18 NOTE — Progress Notes (Signed)
Pre visit review using our clinic review tool, if applicable. No additional management support is needed unless otherwise documented below in the visit note. 

## 2015-09-19 ENCOUNTER — Encounter: Payer: Self-pay | Admitting: Family Medicine

## 2015-09-19 LAB — COMPREHENSIVE METABOLIC PANEL
ALBUMIN: 4.3 g/dL (ref 3.5–5.2)
ALK PHOS: 56 U/L (ref 39–117)
ALT: 23 U/L (ref 0–53)
AST: 20 U/L (ref 0–37)
BILIRUBIN TOTAL: 0.3 mg/dL (ref 0.2–1.2)
BUN: 14 mg/dL (ref 6–23)
CO2: 29 mEq/L (ref 19–32)
Calcium: 9.7 mg/dL (ref 8.4–10.5)
Chloride: 103 mEq/L (ref 96–112)
Creatinine, Ser: 1.11 mg/dL (ref 0.40–1.50)
GFR: 82.39 mL/min (ref >60.00)
GLUCOSE: 85 mg/dL (ref 70–99)
Potassium: 3.9 mEq/L (ref 3.5–5.1)
Sodium: 140 mEq/L (ref 135–145)
TOTAL PROTEIN: 7.8 g/dL (ref 6.0–8.3)

## 2015-09-19 LAB — CBC
HCT: 41.4 % (ref 39.0–52.0)
HEMOGLOBIN: 14.1 g/dL (ref 13.0–17.0)
MCHC: 34.1 g/dL (ref 30.0–36.0)
MCV: 83.9 fl (ref 78.0–100.0)
Platelets: 319 10*3/uL (ref 150.0–400.0)
RBC: 4.94 Mil/uL (ref 4.22–5.81)
RDW: 12.9 % (ref 11.5–15.5)
WBC: 11.2 10*3/uL — ABNORMAL HIGH (ref 4.0–10.5)

## 2015-09-19 LAB — HEMOGLOBIN A1C: Hgb A1c MFr Bld: 5.3 % (ref 4.6–6.5)

## 2015-09-19 LAB — LDL CHOLESTEROL, DIRECT: LDL DIRECT: 124 mg/dL

## 2016-08-31 ENCOUNTER — Ambulatory Visit: Payer: Self-pay | Admitting: Family Medicine

## 2016-09-01 NOTE — Progress Notes (Signed)
[place] at [facility] [address], [place], [place] [address] [phone]  Date:  09/03/2016   Name:  Christopher Nguyen   DOB:  1985-08-08   MRN:  [id number]  PCP:  [name], MD    Chief Complaint: Nevus (c/o several moles that have been present for a while. Several are now raised and pt does have fam hx of melanoma. )   History of Present Illness:  Christopher Nguyen is a 31 y.o. very pleasant male patient who presents with the following:  History of HTN, overweight.  Here today with a concern about skin lesions/ moles  We did start him on lisinopril about a year ago for mild HTN:  He works at [employer] in the [employer] He has not seen a doctor in several years.   His brother and dad have HTN.  He has noted borderline BP for a few years.    He does see Dr. Gaynelle Arabian; he is on clomid.  They are using this for testosterone support/ fertility.  Dr. Darene Lamer checks his testosterone levels for him.  He and his wife are due in February with their first child!  So far the pregnancy is going well  He does cardio 30 min, 4x a week- He is part of a study at [facility] that is studying the effects of exercise. As part of this study they check his BP several times and it has been 140-150/ 90-95 on a consistent basis.   No CP with exercise.  He may sometimes feel SOB when he is running on the treadmill. No SOB with regular every day exertion such as carrying groceries, etc  No recent cholesterol test that he can recall, or blood sugar reading He ate about 3 hours ago He is a non smoker and is otherwise in good health  No fever, chills, belly pain, nausea or vomiting There are no active problems to display for this patient.  BP Readings from Last 3 Encounters:  09/03/16 122/88  09/18/15 (!) 152/98  01/28/15 (!) 142/89   He does check his BP at home- he may run 130/80 He finished the exercise study at [facility]   He and his wife had a son born in March-  their first child.  He is developing well  Tami's brother had melanoma, and his mother also had bladder cancer but never any skin cancers His mother passed away due to CHF last year  His brother is doing well from his melanoma He has never had any skin cancer or any bx of a mole.    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    He last ate around 1pm- he had nachos   Patient Active Problem List   Diagnosis Date Noted  . Overweight 09/18/2015  . Essential hypertension, benign 09/18/2015    Past Medical History:  Diagnosis Date  . 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 CLUB FOOT RELEASE  infant  . TESTICLE BIOPSY Bilateral 01/28/2015   Procedure: BIOPSY TESTICULAR;  Surgeon: Carolan Clines, MD;  Location: [facility];  Service: Urology;  Laterality: Bilateral;    Social History  Substance Use Topics  . Smoking status: Never Smoker  . Smokeless tobacco: Never Used  . Alcohol use 1.8 oz/week    3 Shots of liquor per week    Family History  Problem Relation Age  of Onset  . Bladder Cancer Mother   . Ovarian cancer Mother   . Colon cancer Father   . Hypertension Father   . Hypertension Brother   . Melanoma Brother   . Alcoholism Maternal Grandmother   . Alcohol abuse Maternal Grandfather   . Alcoholism Maternal Grandfather     Allergies  Allergen Reactions  . Cephalosporins Other (See Comments)    Unknown childhood reaction    Medication list has been reviewed and updated.  Current Outpatient Prescriptions on File Prior to Visit  Medication Sig Dispense Refill  . clomiPHENE (CLOMID) 50 MG tablet Take 50 mg by mouth every morning.    Marland Kitchen lisinopril (PRINIVIL,ZESTRIL) 10 MG tablet Take 1 tablet (10 mg total) by mouth daily. 90 tablet 3  . Multiple Vitamin (MULTIVITAMIN) tablet Take 1 tablet by mouth daily.     No current facility-administered medications on file prior to visit.     Review of  Systems:  As per HPI- otherwise negative.   Physical Examination: Vitals:   09/03/16 1608  BP: 122/88  Pulse: 100  Temp: 98.6 F (37 C)   Vitals:   09/03/16 1608  Weight: 183 lb (83 kg)  Height: 5\' 2"  (1.575 m)   Body mass index is 33.47 kg/m. Ideal Body Weight: Weight in (lb) to have BMI = 25: 136.4  GEN: WDWN, NAD, Non-toxic, A & O x 3, overweight, looks well HEENT: Atraumatic, Normocephalic. Neck supple. No masses, No LAD. Ears and Nose: No external deformity. CV: RRR, No M/G/R. No JVD. No thrill. No extra heart sounds. PULM: CTA B, no wheezes, crackles, rhonchi. No retractions. No resp. distress. No accessory muscle use. ABD: S, NT, ND, +BS. No rebound. No HSM. EXTR: No c/c/e NEURO Normal gait.  PSYCH: Normally interactive. Conversant. Not depressed or anxious appearing.  Calm demeanor.  He has multiple skin lesions- skin tags, seborrheic keratosis, and nevi. None that are acutely alarming in appearance    Assessment and Plan: Essential hypertension, benign - Plan: lisinopril (PRINIVIL,ZESTRIL) 10 MG tablet  Multiple nevi - Plan: Ambulatory referral to Dermatology  Family history of melanoma - Plan: Ambulatory referral to Dermatology  Screening for hyperlipidemia - Plan: Lipid panel  Screening for diabetes mellitus - Plan: Comprehensive metabolic panel, Hemoglobin A1c  Screening for deficiency anemia - Plan: CBC  Here today for a recheck BP is under control Continue lisinopril Placed future lab orders as he would like to come in while fasting  Encouraged exercise Will plan further follow- up pending labs. Referral to derm for a skin check    Signed Lamar Blinks, MD

## 2016-09-03 ENCOUNTER — Ambulatory Visit (INDEPENDENT_AMBULATORY_CARE_PROVIDER_SITE_OTHER): Payer: BLUE CROSS/BLUE SHIELD | Admitting: Family Medicine

## 2016-09-03 VITALS — BP 122/88 | HR 100 | Temp 98.6°F | Ht 62.0 in | Wt 183.0 lb

## 2016-09-03 DIAGNOSIS — Z808 Family history of malignant neoplasm of other organs or systems: Secondary | ICD-10-CM

## 2016-09-03 DIAGNOSIS — D229 Melanocytic nevi, unspecified: Secondary | ICD-10-CM | POA: Diagnosis not present

## 2016-09-03 DIAGNOSIS — Z131 Encounter for screening for diabetes mellitus: Secondary | ICD-10-CM | POA: Diagnosis not present

## 2016-09-03 DIAGNOSIS — Z130 Encounter for screening for diseases of the blood and blood-forming organs and certain disorders involving the immune mechanism: Secondary | ICD-10-CM

## 2016-09-03 DIAGNOSIS — Z13220 Encounter for screening for lipoid disorders: Secondary | ICD-10-CM

## 2016-09-03 DIAGNOSIS — I10 Essential (primary) hypertension: Secondary | ICD-10-CM | POA: Diagnosis not present

## 2016-09-03 MED ORDER — LISINOPRIL 10 MG PO TABS: 10.0000 mg | ORAL_TABLET | Freq: Every day | ORAL | 3 refills | Status: DC

## 2016-09-03 NOTE — Patient Instructions (Signed)
It was great to see you today- many congratulations to you and your wife on the birth of your son!  Please come in for a fasting lab draw at your convenience- our lab opens at 7am; if you can call a day or so prior it helps to expedite your visit Try to continue your exercise program for your health I refilled your lisinopril- continue the same dose  We will refer you to dermatology for a complete skin check

## 2016-09-09 ENCOUNTER — Encounter: Payer: Self-pay | Admitting: Family Medicine

## 2016-10-02 DIAGNOSIS — L814 Other melanin hyperpigmentation: Secondary | ICD-10-CM | POA: Diagnosis not present

## 2016-10-02 DIAGNOSIS — L80 Vitiligo: Secondary | ICD-10-CM | POA: Diagnosis not present

## 2016-10-02 DIAGNOSIS — D225 Melanocytic nevi of trunk: Secondary | ICD-10-CM | POA: Diagnosis not present

## 2017-02-23 ENCOUNTER — Ambulatory Visit: Payer: Self-pay | Admitting: Medical

## 2017-02-23 VITALS — BP 128/81 | HR 70 | Temp 99.2°F | Resp 18 | Wt 186.2 lb

## 2017-02-23 DIAGNOSIS — J019 Acute sinusitis, unspecified: Secondary | ICD-10-CM

## 2017-02-23 MED ORDER — AMOXICILLIN-POT CLAVULANATE 875-125 MG PO TABS: 1.0000 | ORAL_TABLET | Freq: Two times a day (BID) | ORAL | 0 refills | Status: DC

## 2017-02-23 NOTE — Patient Instructions (Signed)
Upper Respiratory Infection, Adult Most upper respiratory infections (URIs) are caused by a virus. A URI affects the nose, throat, and upper air passages. The most common type of URI is often called "the common cold." Follow these instructions at home:  Take medicines only as told by your doctor.  Gargle warm saltwater or take cough drops to comfort your throat as told by your doctor.  Use a warm mist humidifier or inhale steam from a shower to increase air moisture. This may make it easier to breathe.  Drink enough fluid to keep your pee (urine) clear or pale yellow.  Eat soups and other clear broths.  Have a healthy diet.  Rest as needed.  Go back to work when your fever is gone or your doctor says it is okay. ? You may need to stay home longer to avoid giving your URI to others. ? You can also wear a face mask and wash your hands often to prevent spread of the virus.  Use your inhaler more if you have asthma.  Do not use any tobacco products, including cigarettes, chewing tobacco, or electronic cigarettes. If you need help quitting, ask your doctor. Contact a doctor if:  You are getting worse, not better.  Your symptoms are not helped by medicine.  You have chills.  You are getting more short of breath.  You have brown or red mucus.  You have yellow or brown discharge from your nose.  You have pain in your face, especially when you bend forward.  You have a fever.  You have puffy (swollen) neck glands.  You have pain while swallowing.  You have white areas in the back of your throat. Get help right away if:  You have very bad or constant: ? Headache. ? Ear pain. ? Pain in your forehead, behind your eyes, and over your cheekbones (sinus pain). ? Chest pain.  You have long-lasting (chronic) lung disease and any of the following: ? Wheezing. ? Long-lasting cough. ? Coughing up blood. ? A change in your usual mucus.  You have a stiff neck.  You have  changes in your: ? Vision. ? Hearing. ? Thinking. ? Mood. This information is not intended to replace advice given to you by your health care provider. Make sure you discuss any questions you have with your health care provider. Document Released: 07/22/2007 Document Revised: 10/06/2015 Document Reviewed: 05/10/2013 Elsevier Interactive Patient Education  2018 [employer]. Sinusitis, Adult Sinusitis is soreness and inflammation of your sinuses. Sinuses are hollow spaces in the bones around your face. They are located:  Around your eyes.  In the middle of your forehead.  Behind your nose.  In your cheekbones.  Your sinuses and nasal passages are lined with a stringy fluid (mucus). Mucus normally drains out of your sinuses. When your nasal tissues get inflamed or swollen, the mucus can get trapped or blocked so air cannot flow through your sinuses. This lets bacteria, viruses, and funguses grow, and that leads to infection. Follow these instructions at home: Medicines  Take, use, or apply over-the-counter and prescription medicines only as told by your doctor. These may include nasal sprays.  If you were prescribed an antibiotic medicine, take it as told by your doctor. Do not stop taking the antibiotic even if you start to feel better. Hydrate and Humidify  Drink enough water to keep your pee (urine) clear or pale yellow.  Use a cool mist humidifier to keep the humidity level in your home above   50%.  Breathe in steam for 10-15 minutes, 3-4 times a day or as told by your doctor. You can do this in the bathroom while a hot shower is running.  Try not to spend time in cool or dry air. Rest  Rest as much as possible.  Sleep with your head raised (elevated).  Make sure to get enough sleep each night. General instructions  Put a warm, moist washcloth on your face 3-4 times a day or as told by your doctor. This will help with discomfort.  Wash your hands often with soap and  water. If there is no soap and water, use hand sanitizer.  Do not smoke. Avoid being around people who are smoking (secondhand smoke).  Keep all follow-up visits as told by your doctor. This is important. Contact a doctor if:  You have a fever.  Your symptoms get worse.  Your symptoms do not get better within 10 days. Get help right away if:  You have a very bad headache.  You cannot stop throwing up (vomiting).  You have pain or swelling around your face or eyes.  You have trouble seeing.  You feel confused.  Your neck is stiff.  You have trouble breathing. This information is not intended to replace advice given to you by your health care provider. Make sure you discuss any questions you have with your health care provider. Document Released: 07/22/2007 Document Revised: 09/29/2015 Document Reviewed: 11/28/2014 Elsevier Interactive Patient Education  2018 Elsevier Inc.  

## 2017-02-23 NOTE — Progress Notes (Signed)
   Subjective:    Patient ID: Christopher Nguyen, male    DOB: 06/11/1985, 32 y.o.   MRN: [id number]  HPI 32 yo male in non acute distress with  3 weeks history started with sore throat that continues, cough productive dark green , and now is white. Nasal congestion with nasal discharge green and sinus pressure behind eyes. Fever and chills this weekend.  Has 48 month old at home, wife and child has been sick.They are now doing better. Patinet takes Clomid prescribed by his Urologist for low T.   Review of Systems  Constitutional: Positive for chills, fatigue and fever.  HENT: Positive for congestion, rhinorrhea, sinus pressure, sinus pain and sore throat. Negative for ear pain and sneezing.   Eyes: Negative for discharge and itching.  Respiratory: Positive for cough, chest tightness and shortness of breath.   Cardiovascular: Negative for chest pain.  Gastrointestinal: Negative for abdominal pain.  Endocrine: Negative for polydipsia, polyphagia and polyuria.  Genitourinary: Negative for dysuria.  Musculoskeletal: Positive for myalgias.  Skin: Negative for rash.  Allergic/Immunologic: Negative for environmental allergies, food allergies and immunocompromised state.  Neurological: Negative for dizziness, syncope, light-headedness and headaches.  Hematological: Negative for adenopathy.  Psychiatric/Behavioral: Negative for behavioral problems, self-injury and suicidal ideas. The patient is not nervous/anxious.        Objective:   Physical Exam  Constitutional: He is oriented to person, place, and time. He appears well-developed and well-nourished.  HENT:  Head: Normocephalic and atraumatic.  Right Ear: Hearing, tympanic membrane, external ear and ear canal normal.  Left Ear: Hearing, external ear and ear canal normal. A middle ear effusion is present.  Nose: Mucosal edema and rhinorrhea present.  Mouth/Throat: Uvula is midline. Posterior oropharyngeal erythema present.  Eyes:  Conjunctivae and EOM are normal. Pupils are equal, round, and reactive to light.  Neck: Normal range of motion. Neck supple.  Cardiovascular: Normal rate, regular rhythm and normal heart sounds.  Pulmonary/Chest: Effort normal and breath sounds normal.  Neurological: He is alert and oriented to person, place, and time.  Skin: Skin is warm and dry.  Psychiatric: He has a normal mood and affect. His behavior is normal. Judgment and thought content normal.     Mild cough in room     Assessment & Plan:  Sinusitis/ Upper respiratory infection. Patient states he can take Amoxil without problems. Meds ordered this encounter  Medications  . amoxicillin-clavulanate (AUGMENTIN) 875-125 MG tablet    Sig: Take 1 tablet by mouth 2 (two) times daily. Patient says he can take amoxil without reaction    Dispense:  20 tablet    Refill:  0   OTC Zyrtec or Claritin and OTC Flonase take as directed.Call if any problems with medication. Return in 3-5 days if not improving. Patient verbalizes understanding and has no questions at discharge.

## 2017-03-08 DIAGNOSIS — E291 Testicular hypofunction: Secondary | ICD-10-CM | POA: Diagnosis not present

## 2017-03-08 DIAGNOSIS — N500 Atrophy of testis: Secondary | ICD-10-CM | POA: Diagnosis not present

## 2018-02-10 DIAGNOSIS — Z113 Encounter for screening for infections with a predominantly sexual mode of transmission: Secondary | ICD-10-CM | POA: Diagnosis not present

## 2018-02-14 DIAGNOSIS — Z3141 Encounter for fertility testing: Secondary | ICD-10-CM | POA: Diagnosis not present

## 2018-06-02 DIAGNOSIS — E291 Testicular hypofunction: Secondary | ICD-10-CM | POA: Diagnosis not present

## 2018-06-08 DIAGNOSIS — N469 Male infertility, unspecified: Secondary | ICD-10-CM | POA: Diagnosis not present

## 2018-06-08 DIAGNOSIS — E291 Testicular hypofunction: Secondary | ICD-10-CM | POA: Diagnosis not present

## 2018-07-05 ENCOUNTER — Other Ambulatory Visit: Payer: Self-pay | Admitting: Family Medicine

## 2018-07-05 DIAGNOSIS — I10 Essential (primary) hypertension: Secondary | ICD-10-CM

## 2018-07-19 DIAGNOSIS — E291 Testicular hypofunction: Secondary | ICD-10-CM | POA: Diagnosis not present

## 2018-08-02 ENCOUNTER — Other Ambulatory Visit: Payer: Self-pay | Admitting: Family Medicine

## 2018-08-02 DIAGNOSIS — I10 Essential (primary) hypertension: Secondary | ICD-10-CM

## 2018-08-11 DIAGNOSIS — E291 Testicular hypofunction: Secondary | ICD-10-CM | POA: Diagnosis not present

## 2018-12-05 DIAGNOSIS — Z20828 Contact with and (suspected) exposure to other viral communicable diseases: Secondary | ICD-10-CM | POA: Diagnosis not present

## 2018-12-05 DIAGNOSIS — R509 Fever, unspecified: Secondary | ICD-10-CM | POA: Diagnosis not present

## 2018-12-05 DIAGNOSIS — R05 Cough: Secondary | ICD-10-CM | POA: Diagnosis not present

## 2018-12-07 ENCOUNTER — Other Ambulatory Visit: Payer: Self-pay

## 2018-12-07 DIAGNOSIS — Z20822 Contact with and (suspected) exposure to covid-19: Secondary | ICD-10-CM

## 2018-12-07 DIAGNOSIS — Z20828 Contact with and (suspected) exposure to other viral communicable diseases: Secondary | ICD-10-CM | POA: Diagnosis not present

## 2018-12-08 LAB — NOVEL CORONAVIRUS, NAA: SARS-CoV-2, NAA: NOT DETECTED

## 2019-01-25 ENCOUNTER — Telehealth: Payer: Self-pay | Admitting: *Deleted

## 2019-01-25 NOTE — Telephone Encounter (Signed)
Received request from [facility] for lisinopril 10mg . Pt has not seen PCP since 08/2016. Sent mychart message to verify pt still sees Dr Lorelei Pont and to notify him of need for appt if still seeing [place].

## 2019-01-31 NOTE — Progress Notes (Signed)
[place] [facility] [address], [place], [place] [address] [phone] [phone]  Date:  02/01/2019   Name:  Christopher Nguyen   DOB:  06-30-1985   MRN:  [id number]  PCP:  [name], MD    Chief Complaint: No chief complaint on file.   History of Present Illness:  Christopher Nguyen is a 33 y.o. 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 person classes much of the time  He needs a refill of his lisinopril  He had been taking about 5 mg of lisinopril so his rx would last a long time  His BP is 130-140/80  His chest may feel tight off and on- may last 30 minutes  This is non exertional He has noted this for about a year  He is not getting a lot of exercise right now but chest tighness is not worse with activity  No family history of heart disease He is a non smoker   His urologist recently changed him from clomid to testosterone injections every 14 days -they are monitoring his levels to make sure they stay in therapeutic range    Patient Active Problem List   Diagnosis Date Noted  . Overweight 09/18/2015  . Essential hypertension, benign 09/18/2015    Past Medical History:  Diagnosis Date  . 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 CLUB FOOT RELEASE  infant  . TESTICLE BIOPSY Bilateral 01/28/2015   Procedure: BIOPSY TESTICULAR;  Surgeon: Carolan Clines, MD;  Location: [facility];  Service: Urology;  Laterality: Bilateral;    Social  History   Tobacco Use  . Smoking status: Never Smoker  . Smokeless tobacco: Never Used  Substance Use Topics  . Alcohol use: Yes    Alcohol/week: 3.0 standard drinks    Types: 3 Shots of liquor per week  . Drug use: No    Family History  Problem Relation Age of Onset  . Bladder Cancer Mother   . Ovarian cancer Mother   . Colon cancer Father   . Hypertension Father   . Hypertension Brother   . Melanoma Brother   . Alcoholism Maternal Grandmother   . Alcohol abuse Maternal Grandfather   . Alcoholism Maternal Grandfather     Allergies  Allergen Reactions  . Cephalosporins Other (See Comments)    Unknown childhood reaction    Medication list has been reviewed and updated.  Current Outpatient Medications on File Prior to Visit  Medication Sig Dispense Refill  . amoxicillin-clavulanate (AUGMENTIN) 875-125 MG tablet Take 1 tablet by mouth 2 (two) times daily. Patient says he can take amoxil without reaction 20 tablet 0  . clomiPHENE (CLOMID) 50 MG tablet Take 50 mg by mouth every morning.    Marland Kitchen lisinopril (ZESTRIL) 10 MG tablet TAKE 1 TABLET(10 MG) BY MOUTH DAILY 15 tablet 0  . Multiple Vitamin (MULTIVITAMIN) tablet Take 1 tablet by mouth daily.     No current facility-administered medications on  file prior to visit.    Review of Systems:  As per HPI- otherwise negative.   Physical Examination: There were no vitals filed for this visit. There were no vitals filed for this visit. There is no height or weight on file to calculate BMI. Ideal Body Weight:    Pt observed over video monitor- he appears well  No cough, wheezing, distress or shortness of breath is noted  Assessment and Plan: Essential hypertension, benign - Plan: lisinopril (ZESTRIL) 10 MG tablet  Virtual visit today after a significant absence to refill lisinopril.  Refilled lisinopril 10 mg.  Patient is often taking only 5 mg, but his systolic blood pressure has been borderline to high.  I asked him to  try taking 10 mg daily and see how this does for him  He has noted some atypical chest tightness for about 1 year.  Not acutely worse.  I scheduled him for a physical and further evaluation this coming Monday.  We will plan to do labs and further evaluate chest tightness at that time.  If he is getting worse or has any other problems in the meantime he will seek immediate care  Signed Lamar Blinks, MD

## 2019-02-01 ENCOUNTER — Encounter: Payer: Self-pay | Admitting: Family Medicine

## 2019-02-01 ENCOUNTER — Ambulatory Visit (INDEPENDENT_AMBULATORY_CARE_PROVIDER_SITE_OTHER): Payer: BC Managed Care – PPO | Admitting: Family Medicine

## 2019-02-01 ENCOUNTER — Other Ambulatory Visit: Payer: Self-pay

## 2019-02-01 DIAGNOSIS — I10 Essential (primary) hypertension: Secondary | ICD-10-CM | POA: Diagnosis not present

## 2019-02-01 MED ORDER — LISINOPRIL 10 MG PO TABS: ORAL_TABLET | ORAL | 3 refills | Status: DC

## 2019-02-03 ENCOUNTER — Other Ambulatory Visit: Payer: Self-pay

## 2019-02-03 NOTE — Progress Notes (Addendum)
[place] at [facility] [address], [place], [place] [address] [phone]  Date:  02/06/2019   Name:  Christopher Nguyen   DOB:  Apr 19, 1985   MRN:  [id number]  PCP:  [name], MD    Chief Complaint: Annual Exam   History of Present Illness:  Christopher Nguyen is a 33 y.o. very pleasant male patient who presents with the following:  Here today for in person follow-up visit, we recently had a virtual visit for concern of high blood pressure He also mentioned intermittent chest tightness, nonexertional, for about a year At our virtual visit last week I increased his lisinopril from 5 to 10 mg, asked him to come in for a physical exam today-further evaluation of chest pain  Every so often he will feel like his heart is fluttering or skipping- this may persist for 15- 20 minute He may notice this every couple of days for a year or so He may notice a tight feeling in his chest on occasion, can be associated with the palpitations The palpitations are not associated with any exercise- not doing a formal exercise program right now There may be an association with caffiene  He is not aware of a family history of CAD- however there have been early deaths on her mother's side of the family He is a never smoker   He is seen by urology for azoospermia and is treated with testosterone  Flu vaccine- give today  Tetanus vaccine- update today   He is fasting except for toast so far today  Patient Active Problem List   Diagnosis Date Noted  . Overweight 09/18/2015  . Essential hypertension, benign 09/18/2015    Past Medical History:  Diagnosis Date  . 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 CLUB FOOT RELEASE  infant  . TESTICLE BIOPSY Bilateral 01/28/2015   Procedure: BIOPSY TESTICULAR;  Surgeon: Carolan Clines, MD;  Location: [facility];  Service: Urology;   Laterality: Bilateral;    Social History   Tobacco Use  . Smoking status: Never Smoker  . Smokeless tobacco: Never Used  Substance Use Topics  . Alcohol use: Yes    Alcohol/week: 3.0 standard drinks    Types: 3 Shots of liquor per week  . Drug use: No    Family History  Problem Relation Age of Onset  . Bladder Cancer Mother   . Ovarian cancer Mother   . Colon cancer Father   . Hypertension Father   . Hypertension Brother   . Melanoma Brother   . Alcoholism Maternal Grandmother   . Alcohol abuse Maternal Grandfather   . Alcoholism Maternal Grandfather     Allergies  Allergen Reactions  . Cephalosporins Other (See Comments)    Unknown childhood reaction    Medication list has been reviewed and updated.  Current Outpatient Medications on File Prior to Visit  Medication Sig Dispense Refill  . lisinopril (ZESTRIL) 10 MG tablet TAKE 1 TABLET(10 MG) BY MOUTH DAILY 90 tablet 3  . Multiple Vitamin (MULTIVITAMIN) tablet Take 1 tablet by mouth daily.     No current facility-administered medications on file prior to visit.    Review of Systems:  As per HPI- otherwise negative.   Physical Examination: Vitals:   02/06/19 1036  BP: 136/84  Pulse: 74  Resp: 16  Temp: (!) 97.3 F (36.3 C)  SpO2: 98%  Vitals:   02/06/19 1036  Weight: 185 lb (83.9 kg)  Height: 5\' 2"  (1.575 m)   Body mass index is 33.84 kg/m. Ideal Body Weight: Weight in (lb) to have BMI = 25: 136.4  Pt observed via video monitor-  He looks well, no cough, wheezing, shortness of breath, distress is noted  EKG today is normal, rate 62 C/w EKG from 2017-no significant change  Assessment and Plan: Physical exam  Essential hypertension, benign - Plan: CBC  Screening for hyperlipidemia - Plan: Lipid panel  Screening for diabetes mellitus - Plan: Comprehensive metabolic panel, Hemoglobin A1c  Chest tightness - Plan: EKG 12-Lead, Exercise Tolerance Test  Immunization due - Plan: TDAP  VACCINE, Flu Vaccine QUAD 6+ mos PF IM (Fluarix Quad PF)  Palpitations - Plan: EKG 12-Lead, TSH, Exercise Tolerance Test  Here today for a CPE Labs pending as above BP well controlled on current regimen Updated immunizations today Discussed options for evaluation of palpitations and chest tightness. Cardiology referral vs ETT.  He would like to start with an ETT- ordered for him now   This visit occurred duuring the SARS-CoV-2 public health emergency.  Safety protocols were in place, including screening questions prior to the visit, additional usage of staff PPE, and extensive cleaning of exam room while observing appropriate contact time as indicated for disinfecting solutions.    Signed Lamar Blinks, MD  Received his labs 12/22- message to pt  Results for orders placed or performed in visit on 02/06/19  CBC  Result Value Ref Range   WBC 10.4 4.0 - 10.5 K/uL   RBC 5.48 4.22 - 5.81 Mil/uL   Platelets 351.0 150.0 - 400.0 K/uL   Hemoglobin 15.2 13.0 - 17.0 g/dL   HCT 46.0 39.0 - 52.0 %   MCV 83.8 78.0 - 100.0 fl   MCHC 33.1 30.0 - 36.0 g/dL   RDW 13.7 11.5 - 15.5 %  Comprehensive metabolic panel  Result Value Ref Range   Sodium 137 135 - 145 mEq/L   Potassium 4.4 3.5 - 5.1 mEq/L   Chloride 102 96 - 112 mEq/L   CO2 28 19 - 32 mEq/L   Glucose, Bld 83 70 - 99 mg/dL   BUN 12 6 - 23 mg/dL   Creatinine, Ser 0.93 0.40 - 1.50 mg/dL   Total Bilirubin 0.4 0.2 - 1.2 mg/dL   Alkaline Phosphatase 63 39 - 117 U/L   AST 15 0 - 37 U/L   ALT 14 0 - 53 U/L   Total Protein 7.8 6.0 - 8.3 g/dL   Albumin 4.5 3.5 - 5.2 g/dL   GFR 93.07 >60.00 mL/min   Calcium 10.1 8.4 - 10.5 mg/dL  Hemoglobin A1c  Result Value Ref Range   Hgb A1c MFr Bld 5.3 4.6 - 6.5 %  Lipid panel  Result Value Ref Range   Cholesterol 195 0 - 200 mg/dL   Triglycerides 254.0 (H) 0.0 - 149.0 mg/dL   HDL 35.60 (L) >39.00 mg/dL   VLDL 50.8 (H) 0.0 - 40.0 mg/dL   Total CHOL/HDL Ratio 5    NonHDL 159.77   TSH  Result  Value Ref Range   TSH 0.99 0.35 - 4.50 uIU/mL  LDL cholesterol, direct  Result Value Ref Range   Direct LDL 128.0 mg/dL

## 2019-02-03 NOTE — Patient Instructions (Addendum)
It was great to see you again today, I will be in touch with your labs ASAP  We will get you set up for a exercise treadmill test to get a closer look at your heart.  Please let me know if anything is changing or getting worse in the meantime  You got your flu and tetanus boosters today   Health Maintenance, Male Adopting a healthy lifestyle and getting preventive care are important in promoting health and wellness. Ask your health care provider about:  The right schedule for you to have regular tests and exams.  Things you can do on your own to prevent diseases and keep yourself healthy. What should I know about diet, weight, and exercise? Eat a healthy diet   Eat a diet that includes plenty of vegetables, fruits, low-fat dairy products, and lean protein.  Do not eat a lot of foods that are high in solid fats, added sugars, or sodium. Maintain a healthy weight Body mass index (BMI) is a measurement that can be used to identify possible weight problems. It estimates body fat based on height and weight. Your health care provider can help determine your BMI and help you achieve or maintain a healthy weight. Get regular exercise Get regular exercise. This is one of the most important things you can do for your health. Most adults should:  Exercise for at least 150 minutes each week. The exercise should increase your heart rate and make you sweat (moderate-intensity exercise).  Do strengthening exercises at least twice a week. This is in addition to the moderate-intensity exercise.  Spend less time sitting. Even light physical activity can be beneficial. Watch cholesterol and blood lipids Have your blood tested for lipids and cholesterol at 33 years of age, then have this test every 5 years. You may need to have your cholesterol levels checked more often if:  Your lipid or cholesterol levels are high.  You are older than 33 years of age.  You are at high risk for heart  disease. What should I know about cancer screening? Many types of cancers can be detected early and may often be prevented. Depending on your health history and family history, you may need to have cancer screening at various ages. This may include screening for:  Colorectal cancer.  Prostate cancer.  Skin cancer.  Lung cancer. What should I know about heart disease, diabetes, and high blood pressure? Blood pressure and heart disease  High blood pressure causes heart disease and increases the risk of stroke. This is more likely to develop in people who have high blood pressure readings, are of African descent, or are overweight.  Talk with your health care provider about your target blood pressure readings.  Have your blood pressure checked: ? Every 3-5 years if you are 45-72 years of age. ? Every year if you are 13 years old or older.  If you are between the ages of 16 and 57 and are a current or former smoker, ask your health care provider if you should have a one-time screening for abdominal aortic aneurysm (AAA). Diabetes Have regular diabetes screenings. This checks your fasting blood sugar level. Have the screening done:  Once every three years after age 62 if you are at a normal weight and have a low risk for diabetes.  More often and at a younger age if you are overweight or have a high risk for diabetes. What should I know about preventing infection? Hepatitis B If you have a higher  risk for hepatitis B, you should be screened for this virus. Talk with your health care provider to find out if you are at risk for hepatitis B infection. Hepatitis C Blood testing is recommended for:  Everyone born from [birth year] through 1965.  Anyone with known risk factors for hepatitis C. Sexually transmitted infections (STIs)  You should be screened each year for STIs, including gonorrhea and chlamydia, if: ? You are sexually active and are younger than 33 years of age. ? You are older  than 33 years of age and your health care provider tells you that you are at risk for this type of infection. ? Your sexual activity has changed since you were last screened, and you are at increased risk for chlamydia or gonorrhea. Ask your health care provider if you are at risk.  Ask your health care provider about whether you are at high risk for HIV. Your health care provider may recommend a prescription medicine to help prevent HIV infection. If you choose to take medicine to prevent HIV, you should first get tested for HIV. You should then be tested every 3 months for as long as you are taking the medicine. Follow these instructions at home: Lifestyle  Do not use any products that contain nicotine or tobacco, such as cigarettes, e-cigarettes, and chewing tobacco. If you need help quitting, ask your health care provider.  Do not use street drugs.  Do not share needles.  Ask your health care provider for help if you need support or information about quitting drugs. Alcohol use  Do not drink alcohol if your health care provider tells you not to drink.  If you drink alcohol: ? Limit how much you have to 0-2 drinks a day. ? Be aware of how much alcohol is in your drink. In the [place] one drink equals one 12 oz bottle of beer (355 mL), one 5 oz glass of wine (148 mL), or one 1 oz glass of hard liquor (44 mL). General instructions  Schedule regular health, dental, and eye exams.  Stay current with your vaccines.  Tell your health care provider if: ? You often feel depressed. ? You have ever been abused or do not feel safe at home. Summary  Adopting a healthy lifestyle and getting preventive care are important in promoting health and wellness.  Follow your health care provider's instructions about healthy diet, exercising, and getting tested or screened for diseases.  Follow your health care provider's instructions on monitoring your cholesterol and blood pressure. This  information is not intended to replace advice given to you by your health care provider. Make sure you discuss any questions you have with your health care provider. Document Released: 08/01/2007 Document Revised: 01/26/2018 Document Reviewed: 01/26/2018 Elsevier Patient Education  2020 [employer].

## 2019-02-06 ENCOUNTER — Other Ambulatory Visit: Payer: Self-pay

## 2019-02-06 ENCOUNTER — Encounter: Payer: Self-pay | Admitting: Family Medicine

## 2019-02-06 ENCOUNTER — Ambulatory Visit (INDEPENDENT_AMBULATORY_CARE_PROVIDER_SITE_OTHER): Payer: BC Managed Care – PPO | Admitting: Family Medicine

## 2019-02-06 VITALS — BP 136/84 | HR 74 | Temp 97.3°F | Resp 16 | Ht 62.0 in | Wt 185.0 lb

## 2019-02-06 DIAGNOSIS — R002 Palpitations: Secondary | ICD-10-CM

## 2019-02-06 DIAGNOSIS — Z23 Encounter for immunization: Secondary | ICD-10-CM | POA: Diagnosis not present

## 2019-02-06 DIAGNOSIS — R0789 Other chest pain: Secondary | ICD-10-CM

## 2019-02-06 DIAGNOSIS — Z13220 Encounter for screening for lipoid disorders: Secondary | ICD-10-CM

## 2019-02-06 DIAGNOSIS — I10 Essential (primary) hypertension: Secondary | ICD-10-CM | POA: Diagnosis not present

## 2019-02-06 DIAGNOSIS — Z131 Encounter for screening for diabetes mellitus: Secondary | ICD-10-CM | POA: Diagnosis not present

## 2019-02-06 DIAGNOSIS — Z0000 Encounter for general adult medical examination without abnormal findings: Secondary | ICD-10-CM | POA: Diagnosis not present

## 2019-02-06 LAB — CBC
HCT: 46 % (ref 39.0–52.0)
Hemoglobin: 15.2 g/dL (ref 13.0–17.0)
MCHC: 33.1 g/dL (ref 30.0–36.0)
MCV: 83.8 fl (ref 78.0–100.0)
Platelets: 351 10*3/uL (ref 150.0–400.0)
RBC: 5.48 Mil/uL (ref 4.22–5.81)
RDW: 13.7 % (ref 11.5–15.5)
WBC: 10.4 10*3/uL (ref 4.0–10.5)

## 2019-02-06 LAB — COMPREHENSIVE METABOLIC PANEL
ALT: 14 U/L (ref 0–53)
AST: 15 U/L (ref 0–37)
Albumin: 4.5 g/dL (ref 3.5–5.2)
Alkaline Phosphatase: 63 U/L (ref 39–117)
BUN: 12 mg/dL (ref 6–23)
CO2: 28 mEq/L (ref 19–32)
Calcium: 10.1 mg/dL (ref 8.4–10.5)
Chloride: 102 mEq/L (ref 96–112)
Creatinine, Ser: 0.93 mg/dL (ref 0.40–1.50)
GFR: 93.07 mL/min (ref >60.00)
Glucose, Bld: 83 mg/dL (ref 70–99)
Potassium: 4.4 mEq/L (ref 3.5–5.1)
Sodium: 137 mEq/L (ref 135–145)
Total Bilirubin: 0.4 mg/dL (ref 0.2–1.2)
Total Protein: 7.8 g/dL (ref 6.0–8.3)

## 2019-02-06 LAB — LIPID PANEL
Cholesterol: 195 mg/dL (ref 0–200)
HDL: 35.6 mg/dL — ABNORMAL LOW (ref >39.00)
NonHDL: 159.77
Total CHOL/HDL Ratio: 5
Triglycerides: 254 mg/dL — ABNORMAL HIGH (ref 0.0–149.0)
VLDL: 50.8 mg/dL — ABNORMAL HIGH (ref 0.0–40.0)

## 2019-02-06 LAB — TSH: TSH: 0.99 u[IU]/mL (ref 0.35–4.50)

## 2019-02-06 LAB — LDL CHOLESTEROL, DIRECT: Direct LDL: 128 mg/dL

## 2019-02-06 LAB — HEMOGLOBIN A1C: Hgb A1c MFr Bld: 5.3 % (ref 4.6–6.5)

## 2019-02-07 ENCOUNTER — Encounter: Payer: Self-pay | Admitting: Family Medicine

## 2019-02-23 ENCOUNTER — Telehealth (HOSPITAL_COMMUNITY): Payer: Self-pay

## 2019-02-23 NOTE — Telephone Encounter (Signed)
Encounter complete. 

## 2019-02-25 ENCOUNTER — Other Ambulatory Visit (HOSPITAL_COMMUNITY)
Admission: RE | Admit: 2019-02-25 | Discharge: 2019-02-25 | Disposition: A | Discharge status: Home / Self Care | Payer: BC Managed Care – PPO | Source: Ambulatory Visit | Attending: Family Medicine | Admitting: Family Medicine

## 2019-02-25 DIAGNOSIS — Z01812 Encounter for preprocedural laboratory examination: Secondary | ICD-10-CM | POA: Insufficient documentation

## 2019-02-25 DIAGNOSIS — Z20822 Contact with and (suspected) exposure to covid-19: Secondary | ICD-10-CM | POA: Diagnosis not present

## 2019-02-26 LAB — NOVEL CORONAVIRUS, NAA (HOSP ORDER, SEND-OUT TO REF LAB; TAT 18-24 HRS): SARS-CoV-2, NAA: NOT DETECTED

## 2019-03-01 ENCOUNTER — Ambulatory Visit (HOSPITAL_COMMUNITY)
Admission: RE | Admit: 2019-03-01 | Discharge: 2019-03-01 | Disposition: A | Discharge status: Home / Self Care | Payer: BC Managed Care – PPO | Source: Ambulatory Visit | Attending: Cardiology | Admitting: Cardiology

## 2019-03-01 ENCOUNTER — Other Ambulatory Visit: Payer: Self-pay

## 2019-03-01 DIAGNOSIS — R0789 Other chest pain: Secondary | ICD-10-CM | POA: Diagnosis not present

## 2019-03-01 DIAGNOSIS — R002 Palpitations: Secondary | ICD-10-CM | POA: Diagnosis not present

## 2019-03-01 LAB — EXERCISE TOLERANCE TEST
Estimated workload: 11.5 METS
Exercise duration (min): 9 min
Exercise duration (sec): 54 s
MPHR: 187 {beats}/min
Peak HR: 181 {beats}/min
Percent HR: 96 %
RPE: 17
Rest HR: 82 {beats}/min

## 2019-03-02 ENCOUNTER — Encounter: Payer: Self-pay | Admitting: Family Medicine

## 2019-04-28 ENCOUNTER — Ambulatory Visit: Payer: BC Managed Care – PPO | Attending: Internal Medicine

## 2019-04-28 DIAGNOSIS — Z23 Encounter for immunization: Secondary | ICD-10-CM

## 2019-04-28 NOTE — Progress Notes (Signed)
   Covid-19 Vaccination Clinic  Name:  [name]    MRN: [id number] DOB: [birth year]  04/28/2019  Mr. House was observed post Covid-19 immunization for 15 minutes without incident. He was provided with Vaccine Information Sheet and instruction to access the V-Safe system.   [name] was instructed to call 911 with any severe reactions post vaccine: Marland Kitchen Difficulty breathing  . Swelling of face and throat  . A fast heartbeat  . A bad rash all over body  . Dizziness and weakness   Immunizations Administered    Name Date Dose VIS Date Route   Pfizer COVID-19 Vaccine 04/28/2019  9:08 AM 0.3 mL 01/27/2019 Intramuscular   Manufacturer: [place]   Lot: [id number]   [place]: [id number]

## 2019-05-22 ENCOUNTER — Ambulatory Visit: Payer: BC Managed Care – PPO | Attending: Internal Medicine

## 2019-05-22 DIAGNOSIS — Z23 Encounter for immunization: Secondary | ICD-10-CM

## 2019-05-22 NOTE — Progress Notes (Signed)
   Covid-19 Vaccination Clinic  Name:  Christopher Nguyen    MRN: [id number] DOB: Dec 16, 1985  05/22/2019  Christopher Nguyen was observed post Covid-19 immunization for 15 minutes without incident. He was provided with Vaccine Information Sheet and instruction to access the V-Safe system.   Christopher Nguyen was instructed to call 911 with any severe reactions post vaccine: Marland Kitchen Difficulty breathing  . Swelling of face and throat  . A fast heartbeat  . A bad rash all over body  . Dizziness and weakness   Immunizations Administered    Name Date Dose VIS Date Route   Pfizer COVID-19 Vaccine 05/22/2019 11:39 AM 0.3 mL 01/27/2019 Intramuscular   Manufacturer: [place]   Lot: [id number]   [place]: [id number]

## 2019-07-12 DIAGNOSIS — E291 Testicular hypofunction: Secondary | ICD-10-CM | POA: Diagnosis not present

## 2019-07-27 DIAGNOSIS — N500 Atrophy of testis: Secondary | ICD-10-CM | POA: Diagnosis not present

## 2019-07-27 DIAGNOSIS — E291 Testicular hypofunction: Secondary | ICD-10-CM | POA: Diagnosis not present

## 2019-08-31 ENCOUNTER — Emergency Department (HOSPITAL_BASED_OUTPATIENT_CLINIC_OR_DEPARTMENT_OTHER)
Admission: EM | Admit: 2019-08-31 | Discharge: 2019-08-31 | Disposition: A | Discharge status: Home / Self Care | Payer: BC Managed Care – PPO | Attending: Emergency Medicine | Admitting: Emergency Medicine

## 2019-08-31 ENCOUNTER — Emergency Department (HOSPITAL_BASED_OUTPATIENT_CLINIC_OR_DEPARTMENT_OTHER): Payer: BC Managed Care – PPO

## 2019-08-31 ENCOUNTER — Encounter (HOSPITAL_BASED_OUTPATIENT_CLINIC_OR_DEPARTMENT_OTHER): Payer: Self-pay

## 2019-08-31 ENCOUNTER — Other Ambulatory Visit: Payer: Self-pay

## 2019-08-31 DIAGNOSIS — R0789 Other chest pain: Secondary | ICD-10-CM

## 2019-08-31 DIAGNOSIS — R079 Chest pain, unspecified: Secondary | ICD-10-CM | POA: Diagnosis not present

## 2019-08-31 DIAGNOSIS — I10 Essential (primary) hypertension: Secondary | ICD-10-CM | POA: Insufficient documentation

## 2019-08-31 DIAGNOSIS — Z79899 Other long term (current) drug therapy: Secondary | ICD-10-CM | POA: Diagnosis not present

## 2019-08-31 DIAGNOSIS — R202 Paresthesia of skin: Secondary | ICD-10-CM | POA: Diagnosis not present

## 2019-08-31 DIAGNOSIS — F419 Anxiety disorder, unspecified: Secondary | ICD-10-CM | POA: Diagnosis not present

## 2019-08-31 HISTORY — DX: Other specified abnormal findings of blood chemistry: R79.89

## 2019-08-31 HISTORY — DX: Essential (primary) hypertension: I10

## 2019-08-31 LAB — CBC
HCT: 46.2 % (ref 39.0–52.0)
Hemoglobin: 15.7 g/dL (ref 13.0–17.0)
MCH: 28.5 pg (ref 26.0–34.0)
MCHC: 34 g/dL (ref 30.0–36.0)
MCV: 83.8 fL (ref 80.0–100.0)
Platelets: 358 10*3/uL (ref 150–400)
RBC: 5.51 MIL/uL (ref 4.22–5.81)
RDW: 12.7 % (ref 11.5–15.5)
WBC: 11.5 10*3/uL — ABNORMAL HIGH (ref 4.0–10.5)
nRBC: 0 % (ref 0.0–0.2)

## 2019-08-31 LAB — BASIC METABOLIC PANEL
Anion gap: 11 (ref 5–15)
BUN: 14 mg/dL (ref 6–20)
CO2: 25 mmol/L (ref 22–32)
Calcium: 9.2 mg/dL (ref 8.9–10.3)
Chloride: 100 mmol/L (ref 98–111)
Creatinine, Ser: 0.87 mg/dL (ref 0.61–1.24)
GFR calc Af Amer: >60 mL/min (ref >60)
GFR calc non Af Amer: >60 mL/min (ref >60)
Glucose, Bld: 95 mg/dL (ref 70–99)
Potassium: 4.3 mmol/L (ref 3.5–5.1)
Sodium: 136 mmol/L (ref 135–145)

## 2019-08-31 LAB — TROPONIN I (HIGH SENSITIVITY)
Troponin I (High Sensitivity): 3 ng/L (ref <18)
Troponin I (High Sensitivity): 3 ng/L (ref <18)

## 2019-08-31 LAB — D-DIMER, QUANTITATIVE: D-Dimer, Quant: <0.27 ug/mL-FEU (ref 0.00–0.50)

## 2019-08-31 MED ORDER — SODIUM CHLORIDE 0.9% FLUSH
3.0000 mL | Freq: Once | INTRAVENOUS | Status: DC
  Filled 2019-08-31: qty 3

## 2019-08-31 NOTE — ED Triage Notes (Addendum)
C/o CP x 4 days-pt concerned he ate "a delta 8 THC gummy" 4 days ago-NAD-steady gait

## 2019-08-31 NOTE — Discharge Instructions (Addendum)
Continue your home medications as previously prescribed. Stop taking the THC Gummies as I believe this may have been a reaction to that ingestion. Follow-up with your primary care provider. Return to the ER for worsening chest pain, shortness of breath, leg swelling, vomiting or coughing up blood.

## 2019-08-31 NOTE — ED Provider Notes (Signed)
[facility] Provider Note   CSN: [id number] Arrival date & time: 08/31/19  1115     History Chief Complaint  Patient presents with  . Chest Pain    Christopher Nguyen is a 34 y.o. male with a past medical history of hypertension, currently on testosterone injections for the past year presenting to the ED with a chief complaint of chest pain and bloating.  States that on 08/28/2019, patient ate a delta 8 THC gummy that he got from a shop.  States that he is even a delta 9 THC gummy but this was the first time he ate a delta 8 one.  He states that after this he began experiencing left-sided chest pain, feeling like the left side of his body was numb, bloating, nausea and shortness of breath.  The bloating, chest pain and shortness of breath has persisted since onset.  Also reports "full on anxiety attack" after first eating the Gummies.  Reports chest pain is worse with deep breathing, reports shortness of breath with ambulation.  States that none of the symptoms are normal for him to have.  He reports compliance with his home lisinopril and last testosterone injection was on 08/28/2019 as well.  No leg swelling, recent immobilization, history of DVT, PE or MI.  He has not taken any medications to help with the symptoms at home.  He denies any sick contacts with similar symptoms.  States that his numbness has improved.  HPI     Past Medical History:  Diagnosis Date  . 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 Hypertension   . Low testosterone     Patient Active Problem List   Diagnosis Date Noted  . Overweight 09/18/2015  . Essential hypertension, benign 09/18/2015    Past Surgical History:  Procedure Laterality Date  . CLUB FOOT RELEASE  infant  . TESTICLE BIOPSY Bilateral 01/28/2015   Procedure: BIOPSY TESTICULAR;  Surgeon: Carolan Clines, MD;  Location: [facility];  Service: Urology;  Laterality: Bilateral;       Family History  Problem  Relation Age of Onset  . Bladder Cancer Mother   . Ovarian cancer Mother   . Colon cancer Father   . Hypertension Father   . Hypertension Brother   . Melanoma Brother   . Alcoholism Maternal Grandmother   . Alcohol abuse Maternal Grandfather   . Alcoholism Maternal Grandfather     Social History   Tobacco Use  . Smoking status: Never Smoker  . Smokeless tobacco: Never Used  Vaping Use  . Vaping Use: Never used  Substance Use Topics  . Alcohol use: Yes    Comment: weekly  . Drug use: No    Home Medications Prior to Admission medications   Medication Sig Start Date End Date Taking? Authorizing Provider  lisinopril (ZESTRIL) 10 MG tablet TAKE 1 TABLET(10 MG) BY MOUTH DAILY 02/01/19  Yes Copland, Gay Filler, MD  Multiple Vitamin (MULTIVITAMIN) tablet Take 1 tablet by mouth daily.    [provider]  testosterone cypionate (DEPOTESTOSTERONE CYPIONATE) 200 MG/ML injection Inject 200 mg into the muscle every 14 (fourteen) days. 08/06/19   [provider]    Allergies    Cephalosporins  Review of Systems   Review of Systems  Constitutional: Negative for appetite change, chills and fever.  HENT: Negative for ear pain, rhinorrhea, sneezing and sore throat.   Eyes: Negative for photophobia and visual disturbance.  Respiratory: Positive for chest tightness and shortness of  breath. Negative for cough and wheezing.   Cardiovascular: Positive for chest pain. Negative for palpitations.  Gastrointestinal: Positive for nausea. Negative for abdominal pain, blood in stool, constipation, diarrhea and vomiting.  Genitourinary: Negative for dysuria, hematuria and urgency.  Musculoskeletal: Negative for myalgias.  Skin: Negative for rash.  Neurological: Negative for dizziness, weakness and light-headedness.  Psychiatric/Behavioral: The patient is nervous/anxious.     Physical Exam Updated Vital Signs BP (!) 147/93   Pulse 88   Temp 98.6 F (37 C) (Oral)   Resp 11    Ht 5\' 4"  (1.626 m)   Wt 77.8 kg   SpO2 98%   BMI 29.44 kg/m   Physical Exam Vitals and nursing note reviewed.  Constitutional:      General: He is not in acute distress.    Appearance: He is well-developed.     Comments: Speaking complete sentences without difficulty.  No signs of respiratory distress.  HENT:     Head: Normocephalic and atraumatic.     Nose: Nose normal.  Eyes:     General: No scleral icterus.       Left eye: No discharge.     Conjunctiva/sclera: Conjunctivae normal.  Cardiovascular:     Rate and Rhythm: Normal rate and regular rhythm.     Heart sounds: Normal heart sounds. No murmur heard.  No friction rub. No gallop.   Pulmonary:     Effort: Pulmonary effort is normal. No respiratory distress.     Breath sounds: Normal breath sounds.  Abdominal:     General: Bowel sounds are normal. There is no distension.     Palpations: Abdomen is soft.     Tenderness: There is no abdominal tenderness. There is no guarding.  Musculoskeletal:        General: Normal range of motion.     Cervical back: Normal range of motion and neck supple.     Right lower leg: No tenderness. No edema.     Left lower leg: No tenderness. No edema.     Comments: No lower extremity edema, erythema or calf tenderness bilaterally.  Skin:    General: Skin is warm and dry.     Findings: No rash.  Neurological:     Mental Status: He is alert.     Motor: No abnormal muscle tone.     Coordination: Coordination normal.     ED Results / Procedures / Treatments   Labs (all labs ordered are listed, but only abnormal results are displayed) Labs Reviewed  CBC - Abnormal; Notable for the following components:      Result Value   WBC 11.5 (*)    All other components within normal limits  BASIC METABOLIC PANEL  D-DIMER, QUANTITATIVE (NOT AT [facility])  TROPONIN I (HIGH SENSITIVITY)  TROPONIN I (HIGH SENSITIVITY)    EKG EKG Interpretation  Date/Time:  Thursday August 31 2019 11:20:31  EDT Ventricular Rate:  85 PR Interval:  128 QRS Duration: 96 QT Interval:  328 QTC Calculation: 390 R Axis:   41 Text Interpretation: Sinus rhythm with marked sinus arrhythmia Otherwise normal ECG No old tracing to compare Confirmed by Calvert Cantor [phone]) on 08/31/2019 11:23:08 AM   Radiology DG Chest 2 View  Result Date: 08/31/2019 CLINICAL DATA:  Chest pain over the last 4 days. EXAM: CHEST - 2 VIEW COMPARISON:  None. FINDINGS: Heart size is normal. Mediastinal shadows are normal. The lungs are clear. No bronchial thickening. No infiltrate, mass, effusion or collapse. Pulmonary vascularity is normal.  No bony abnormality. IMPRESSION: Normal chest Electronically Signed   By: Nelson Chimes M.D.   On: 08/31/2019 11:53    Procedures Procedures (including critical care time)  Medications Ordered in ED Medications  sodium chloride flush (NS) 0.9 % injection 3 mL (has no administration in time range)    ED Course  I have reviewed the triage vital signs and the nursing notes.  Pertinent labs & imaging results that were available during my care of the patient were reviewed by me and considered in my medical decision making (see chart for details).    MDM Rules/Calculators/A&P                          34 year old male with past medical history of hypertension currently on testosterone injections for the past year presenting to the ED with a chief complaint of chest pain and bloating.  3 days ago ate a delta a THC gummy for the first time.  After this started experiencing bloating, nausea, shortness of breath, numbness in the left side of his body and chest pain.  Numbness has improved but he continues to have chest pain, bloating and shortness of breath.  Was concerned that he was having an anxiety attack but symptoms persisted.  No leg swelling, recent mobilization, history of DVT, PE, MI or fevers.  On exam patient is overall well-appearing.  He is not tachycardic tachypneic or hypoxic.   No lower extremity edema, erythema or calf tenderness that concern me for DVT.  EKG here shows sinus rhythm with sinus arrhythmia.  No ischemic changes.  Chest x-ray is unremarkable.  CBC, BMP and troponin unremarkable D-dimer was obtained due to exogenous hormone use.  This was negative so I doubt PE as a cause of his symptoms.  Delta troponin was obtained which was also normal.  Suspect that symptoms could be due to musculoskeletal versus anxiety versus reaction to this THC gummy versus GERD.  Told patient that he should discontinue use of this and follow-up with PCP.  He is comfortable with discharge home and remains comfortable here.  Return precautions given.   Patient is hemodynamically stable, in NAD, and able to ambulate in the ED. Evaluation does not show pathology that would require ongoing emergent intervention or inpatient treatment. I explained the diagnosis to the patient. Pain has been managed and has no complaints prior to discharge. Patient is comfortable with above plan and is stable for discharge at this time. All questions were answered prior to disposition. Strict return precautions for returning to the ED were discussed. Encouraged follow up with PCP.   An After Visit Summary was printed and given to the patient.   Portions of this note were generated with [employer]. Dictation errors may occur despite best attempts at proofreading.  Final Clinical Impression(s) / ED Diagnoses Final diagnoses:  Chest wall pain  Anxiety    Rx / DC Orders ED Discharge Orders    None       Delia Heady, PA-C 08/31/19 1521    Truddie Hidden, MD 09/01/19 [phone]

## 2019-09-05 NOTE — Progress Notes (Signed)
Christopher Nguyen at [facility] [place], [address], [place] [address] [phone]  Date:  09/06/2019   Name:  Christopher Nguyen   DOB:  [birth year]   MRN:  [id number]  PCP:  [name], MD    Chief Complaint: ER follow up (chest wall pain follow up, abdominal pain)   History of Present Illness:  Christopher Nguyen is a 34 y.o. very pleasant male patient who presents with the following:  Patient with history of hypertension, overweight, prior smoker, using testosterone therapy Here today to follow-up on recent ER visit Ved was seen in the emergency department on July 15-3 days prior he had tried a different type of THC gummy which may have triggered a panic attack. ER evaluation was reassuring, patient discharged to home with likely musculoskeletal chest pain He had 2 sets of troponin which were stable and normal, negative D-dimer, negative chest x-ray, reassuring EKG  Covid series is complete Most recent lipids in December  He notes that he is still not feeling great- he will notice RUQ pain after eating maybe the last 3 days It does not happen every time he eats. Never had this in the past  He has not noted correlation with type of food- he is trying to eat a lighter diet the last few days but not helping that much No vomiting. He has noted acid reflux/ indigestion No diarrhea No fever   He is not aware of a family history of GB issues   His home BP has been ok- highest 130/80 Not having SOB like he was in the past He may get back pain off and on- ibuprofen seems to help  Patient Active Problem List   Diagnosis Date Noted  . Overweight 09/18/2015  . Essential hypertension, benign 09/18/2015    Past Medical History:  Diagnosis Date  . 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 Hypertension   . Low testosterone     Past Surgical History:  Procedure Laterality Date  . CLUB FOOT RELEASE  infant  . TESTICLE BIOPSY Bilateral  01/28/2015   Procedure: BIOPSY TESTICULAR;  Surgeon: Carolan Clines, MD;  Location: [facility];  Service: Urology;  Laterality: Bilateral;    Social History   Tobacco Use  . Smoking status: Never Smoker  . Smokeless tobacco: Never Used  Vaping Use  . Vaping Use: Never used  Substance Use Topics  . Alcohol use: Yes    Comment: weekly  . Drug use: No    Family History  Problem Relation Age of Onset  . Bladder Cancer Mother   . Ovarian cancer Mother   . Colon cancer Father   . Hypertension Father   . Hypertension Brother   . Melanoma Brother   . Alcoholism Maternal Grandmother   . Alcohol abuse Maternal Grandfather   . Alcoholism Maternal Grandfather     Allergies  Allergen Reactions  . Cephalosporins Other (See Comments)    Unknown childhood reaction    Medication list has been reviewed and updated.  Current Outpatient Medications on File Prior to Visit  Medication Sig Dispense Refill  . lisinopril (ZESTRIL) 10 MG tablet TAKE 1 TABLET(10 MG) BY MOUTH DAILY 90 tablet 3  . Multiple Vitamin (MULTIVITAMIN) tablet Take 1 tablet by mouth daily.    Marland Kitchen testosterone cypionate (DEPOTESTOSTERONE CYPIONATE) 200 MG/ML injection Inject 200 mg into the muscle every 14 (fourteen) days.     No current facility-administered medications  on file prior to visit.    Review of Systems:  As per HPI- otherwise negative.   Physical Examination: Vitals:   09/06/19 1416  BP: 126/88  Pulse: 94  Resp: 17  Temp: 98.2 F (36.8 C)  SpO2: 97%   Vitals:   09/06/19 1416  Weight: 179 lb (81.2 kg)  Height: 5\' 4"  (1.626 m)   Body mass index is 30.73 kg/m. Ideal Body Weight: Weight in (lb) to have BMI = 25: 145.3  GEN: no acute distress.  Obese, otherwise looks well HEENT: Atraumatic, Normocephalic.  Ears and Nose: No external deformity. CV: RRR, No M/G/R. No JVD. No thrill. No extra heart sounds. PULM: CTA B, no wheezes, crackles, rhonchi. No retractions. No resp.  distress. No accessory muscle use.   ABD: S, ND, +BS. No rebound. No HSM.Right upper quadrant tenderness is present with mildly positive Murphy sign EXTR: No c/c/e PSYCH: Normally interactive. Conversant.    Assessment and Plan: RUQ pain - Plan: CBC, Comprehensive metabolic panel, US Abdomen Limited RUQ, Hepatitis, Acute  Hospital discharge follow-up  Chest wall pain  Patient here today to follow-up in right upper quadrant pain, suspect he may have cholelithiasis and gallbladder colic Symptoms are subacute, he is not febrile.  We will plan to obtain lab work today, and ultrasound tomorrow In the meantime advised him to follow a light low-fat diet, and to seek care immediately to ER if he gets worse.  He states understanding and agreement  Signed Lamar Blinks, MD  Received labs so far as follows, message to patient Normal white blood cell count is reassuring  Your labs so far look good, white cell count is normal which is reassuring that this is not a more acute gallbladder issue Liver function tests are also normal I see that your ultrasound is scheduled for tomorrow, I will be in touch with this report as soon as possible Results for orders placed or performed in visit on 09/06/19  CBC  Result Value Ref Range   WBC 9.2 4.0 - 10.5 K/uL   RBC 5.26 4.22 - 5.81 Mil/uL   Platelets 351.0 150 - 400 K/uL   Hemoglobin 15.1 13.0 - 17.0 g/dL   HCT 43.8 39 - 52 %   MCV 83.3 78.0 - 100.0 fl   MCHC 34.4 30.0 - 36.0 g/dL   RDW 13.8 11.5 - 15.5 %  Comprehensive metabolic panel  Result Value Ref Range   Sodium 137 135 - 145 mEq/L   Potassium 3.7 3.5 - 5.1 mEq/L   Chloride 102 96 - 112 mEq/L   CO2 27 19 - 32 mEq/L   Glucose, Bld 109 (H) 70 - 99 mg/dL   BUN 21 6 - 23 mg/dL   Creatinine, Ser 0.99 0.40 - 1.50 mg/dL   Total Bilirubin 0.3 0.2 - 1.2 mg/dL   Alkaline Phosphatase 62 39 - 117 U/L   AST 15 0 - 37 U/L   ALT 15 0 - 53 U/L   Total Protein 7.8 6.0 - 8.3 g/dL   Albumin 4.4 3.5  - 5.2 g/dL   GFR 86.29 >60.00 mL/min   Calcium 9.8 8.4 - 10.5 mg/dL

## 2019-09-05 NOTE — Patient Instructions (Addendum)
It was good to see you again today-I am sorry you are not feeling well! Please go to the lab, and angiogram for imaging department to set up your gallbladder ultrasound.  If they are not able to do this here tomorrow please come back upstairs and let someone at the front desk know-in that case I will find a different location  In the meantime, eat a very light low-fat diet-dry toast, bananas, other fruit, oatmeal  If you start to have more severe pain, vomiting or fever please go to the emergency room

## 2019-09-06 ENCOUNTER — Encounter: Payer: Self-pay | Admitting: Family Medicine

## 2019-09-06 ENCOUNTER — Ambulatory Visit: Payer: BC Managed Care – PPO | Admitting: Family Medicine

## 2019-09-06 ENCOUNTER — Other Ambulatory Visit: Payer: Self-pay

## 2019-09-06 VITALS — BP 126/88 | HR 94 | Temp 98.2°F | Resp 17 | Ht 64.0 in | Wt 179.0 lb

## 2019-09-06 DIAGNOSIS — R1011 Right upper quadrant pain: Secondary | ICD-10-CM | POA: Diagnosis not present

## 2019-09-06 DIAGNOSIS — Z09 Encounter for follow-up examination after completed treatment for conditions other than malignant neoplasm: Secondary | ICD-10-CM

## 2019-09-06 DIAGNOSIS — R0789 Other chest pain: Secondary | ICD-10-CM

## 2019-09-06 LAB — COMPREHENSIVE METABOLIC PANEL
ALT: 15 U/L (ref 0–53)
AST: 15 U/L (ref 0–37)
Albumin: 4.4 g/dL (ref 3.5–5.2)
Alkaline Phosphatase: 62 U/L (ref 39–117)
BUN: 21 mg/dL (ref 6–23)
CO2: 27 mEq/L (ref 19–32)
Calcium: 9.8 mg/dL (ref 8.4–10.5)
Chloride: 102 mEq/L (ref 96–112)
Creatinine, Ser: 0.99 mg/dL (ref 0.40–1.50)
GFR: 86.29 mL/min (ref >60.00)
Glucose, Bld: 109 mg/dL — ABNORMAL HIGH (ref 70–99)
Potassium: 3.7 mEq/L (ref 3.5–5.1)
Sodium: 137 mEq/L (ref 135–145)
Total Bilirubin: 0.3 mg/dL (ref 0.2–1.2)
Total Protein: 7.8 g/dL (ref 6.0–8.3)

## 2019-09-06 LAB — CBC
HCT: 43.8 % (ref 39.0–52.0)
Hemoglobin: 15.1 g/dL (ref 13.0–17.0)
MCHC: 34.4 g/dL (ref 30.0–36.0)
MCV: 83.3 fl (ref 78.0–100.0)
Platelets: 351 10*3/uL (ref 150.0–400.0)
RBC: 5.26 Mil/uL (ref 4.22–5.81)
RDW: 13.8 % (ref 11.5–15.5)
WBC: 9.2 10*3/uL (ref 4.0–10.5)

## 2019-09-07 ENCOUNTER — Encounter: Payer: Self-pay | Admitting: Family Medicine

## 2019-09-07 ENCOUNTER — Ambulatory Visit (INDEPENDENT_AMBULATORY_CARE_PROVIDER_SITE_OTHER): Payer: BC Managed Care – PPO

## 2019-09-07 ENCOUNTER — Ambulatory Visit (HOSPITAL_BASED_OUTPATIENT_CLINIC_OR_DEPARTMENT_OTHER): Admission: RE | Admit: 2019-09-07 | Payer: BC Managed Care – PPO | Source: Ambulatory Visit

## 2019-09-07 DIAGNOSIS — R1011 Right upper quadrant pain: Secondary | ICD-10-CM | POA: Diagnosis not present

## 2019-09-07 LAB — HEPATITIS PANEL, ACUTE
Hep A IgM: NONREACTIVE
Hep B C IgM: NONREACTIVE
Hepatitis B Surface Ag: NONREACTIVE
Hepatitis C Ab: NONREACTIVE
SIGNAL TO CUT-OFF: 0.03 (ref <1.00)

## 2019-11-14 ENCOUNTER — Encounter: Payer: Self-pay | Admitting: Family Medicine

## 2020-01-25 DIAGNOSIS — E291 Testicular hypofunction: Secondary | ICD-10-CM | POA: Diagnosis not present

## 2020-02-01 DIAGNOSIS — E291 Testicular hypofunction: Secondary | ICD-10-CM | POA: Diagnosis not present

## 2020-02-25 NOTE — Progress Notes (Addendum)
[facility] [address] [place], [place] [address] [phone]  Date:  02/28/2020   Name:  Christopher Nguyen   DOB:  1985/03/07   MRN:  [id number]  PCP:  [name], MD    Chief Complaint: Abdominal Pain (RUQ pain, chronic pain/)   History of Present Illness:  Christopher Nguyen is a 35 y.o. very pleasant male patient who presents with the following:  Patient today with concern of abdominal pain Last seen by myself in June of this year-at that time he had right upper quadrant pain.  Right upper quadrant ultrasound showed fatty liver only  Patient with history of hypertension, overweight, prior smoker, using testosterone therapy  Flu vaccine- give today  COVID-19 booster- not done yet, he plans to do this week  Over the last 6 months he has noted a few bouts of abd pain and diarrhea- this may occur every 8 week or so and last for 1-2 weeks The pain and diarrhea go together No vomiting Eating makes it worse The type of food does not matter He tried watching dairy, fatty foods- does not make a difference in his episodes of pain and diarrhea  No blood in mucus  No history of chron disease or colon cancer  His father died of colon cancer- dx in his 43s, death age 24  No weight change noted  Patient Active Problem List   Diagnosis Date Noted  . Overweight 09/18/2015  . Essential hypertension, benign 09/18/2015    Past Medical History:  Diagnosis Date  . 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 Hypertension   . Low testosterone     Past Surgical History:  Procedure Laterality Date  . CLUB FOOT RELEASE  infant  . TESTICLE BIOPSY Bilateral 01/28/2015   Procedure: BIOPSY TESTICULAR;  Surgeon: Carolan Clines, MD;  Location: [facility];  Service: Urology;  Laterality: Bilateral;    Social History   Tobacco Use  . Smoking status: Never Smoker  . Smokeless tobacco: Never Used  Vaping Use  .  Vaping Use: Never used  Substance Use Topics  . Alcohol use: Yes    Comment: weekly  . Drug use: No    Family History  Problem Relation Age of Onset  . Bladder Cancer Mother   . Ovarian cancer Mother   . Colon cancer Father   . Hypertension Father   . Hypertension Brother   . Melanoma Brother   . Alcoholism Maternal Grandmother   . Alcohol abuse Maternal Grandfather   . Alcoholism Maternal Grandfather     Allergies  Allergen Reactions  . Cephalosporins Other (See Comments)    Unknown childhood reaction    Medication list has been reviewed and updated.  Current Outpatient Medications on File Prior to Visit  Medication Sig Dispense Refill  . lisinopril (ZESTRIL) 10 MG tablet TAKE 1 TABLET(10 MG) BY MOUTH DAILY 90 tablet 3  . Multiple Vitamin (MULTIVITAMIN) tablet Take 1 tablet by mouth daily.    Marland Kitchen testosterone cypionate (DEPOTESTOSTERONE CYPIONATE) 200 MG/ML injection Inject 100 mg into the muscle every 7 (seven) days.     No current facility-administered medications on file prior to visit.    Review of Systems:  As per HPI- otherwise negative.   Physical Examination: Vitals:   02/28/20 1439  BP: 124/82  Pulse: 78  Resp: 17  SpO2: 99%   Vitals:   02/28/20 1439  Weight: 178 lb (  80.7 kg)  Height: 5\' 4"  (1.626 m)   Body mass index is 30.55 kg/m. Ideal Body Weight: Weight in (lb) to have BMI = 25: 145.3  GEN: no acute distress.  Overweight, looks well  HEENT: Atraumatic, Normocephalic.  Ears and Nose: No external deformity. CV: RRR, No M/G/R. No JVD. No thrill. No extra heart sounds. PULM: CTA B, no wheezes, crackles, rhonchi. No retractions. No resp. distress. No accessory muscle use. ABD: S, NT, ND, +BS. No rebound. No HSM.  At this time belly is benign  EXTR: No c/c/e PSYCH: Normally interactive. Conversant.    Wt Readings from Last 3 Encounters:  02/28/20 178 lb (80.7 kg)  09/06/19 179 lb (81.2 kg)  08/31/19 171 lb 8 oz (77.8 kg)      Assessment and Plan: RUQ pain - Plan: CBC, Comprehensive metabolic panel, Amylase, Lipase, Ambulatory referral to Gastroenterology  Essential hypertension, benign - Plan: CBC, Comprehensive metabolic panel  Needs flu shot - Plan: Flu Vaccine QUAD 6+ mos PF IM (Fluarix Quad PF)  Pt here today with recurrent RUQ pain assoc with diarrhea Father with history of death from colon cancer Previous RUQ [place] negative  Repeat labs today, add on pancreatic enzymes  Will refer to GI Pt will let me know if any change or worsening of his symptoms in the meantime  This visit occurred during the SARS-CoV-2 public health emergency.  Safety protocols were in place, including screening questions prior to the visit, additional usage of staff PPE, and extensive cleaning of exam room while observing appropriate contact time as indicated for disinfecting solutions.    Signed Lamar Blinks, MD  Received his labs 1/13, message to patient  Results for orders placed or performed in visit on 02/28/20  CBC  Result Value Ref Range   WBC 10.6 (H) 4.0 - 10.5 K/uL   RBC 5.63 4.22 - 5.81 Mil/uL   Platelets 385.0 150.0 - 400.0 K/uL   Hemoglobin 16.5 13.0 - 17.0 g/dL   HCT 47.7 39.0 - 52.0 %   MCV 84.7 78.0 - 100.0 fl   MCHC 34.6 30.0 - 36.0 g/dL   RDW 13.2 11.5 - 15.5 %  Comprehensive metabolic panel  Result Value Ref Range   Sodium 135 135 - 145 mEq/L   Potassium 4.4 3.5 - 5.1 mEq/L   Chloride 100 96 - 112 mEq/L   CO2 29 19 - 32 mEq/L   Glucose, Bld 83 70 - 99 mg/dL   BUN 13 6 - 23 mg/dL   Creatinine, Ser 1.41 0.40 - 1.50 mg/dL   Total Bilirubin 0.5 0.2 - 1.2 mg/dL   Alkaline Phosphatase 73 39 - 117 U/L   AST 20 0 - 37 U/L   ALT 20 0 - 53 U/L   Total Protein 8.2 6.0 - 8.3 g/dL   Albumin 5.1 3.5 - 5.2 g/dL   GFR 64.81 >60.00 mL/min   Calcium 10.0 8.4 - 10.5 mg/dL  Amylase  Result Value Ref Range   Amylase 21 (L) 27 - 131 U/L  Lipase  Result Value Ref Range   Lipase 41.0 11.0 - 59.0 U/L

## 2020-02-28 ENCOUNTER — Other Ambulatory Visit: Payer: Self-pay

## 2020-02-28 ENCOUNTER — Encounter: Payer: Self-pay | Admitting: Family Medicine

## 2020-02-28 ENCOUNTER — Ambulatory Visit: Payer: BC Managed Care – PPO | Admitting: Family Medicine

## 2020-02-28 VITALS — BP 124/82 | HR 78 | Resp 17 | Ht 64.0 in | Wt 178.0 lb

## 2020-02-28 DIAGNOSIS — Z23 Encounter for immunization: Secondary | ICD-10-CM

## 2020-02-28 DIAGNOSIS — I10 Essential (primary) hypertension: Secondary | ICD-10-CM

## 2020-02-28 DIAGNOSIS — R1011 Right upper quadrant pain: Secondary | ICD-10-CM

## 2020-02-28 NOTE — Patient Instructions (Signed)
Good to see you again today-we will get your labs and be back in touch asap I am going to refer you to GI for further eval as well

## 2020-02-29 ENCOUNTER — Encounter: Payer: Self-pay | Admitting: Family Medicine

## 2020-02-29 LAB — COMPREHENSIVE METABOLIC PANEL
ALT: 20 U/L (ref 0–53)
AST: 20 U/L (ref 0–37)
Albumin: 5.1 g/dL (ref 3.5–5.2)
Alkaline Phosphatase: 73 U/L (ref 39–117)
BUN: 13 mg/dL (ref 6–23)
CO2: 29 mEq/L (ref 19–32)
Calcium: 10 mg/dL (ref 8.4–10.5)
Chloride: 100 mEq/L (ref 96–112)
Creatinine, Ser: 1.41 mg/dL (ref 0.40–1.50)
GFR: 64.81 mL/min (ref >60.00)
Glucose, Bld: 83 mg/dL (ref 70–99)
Potassium: 4.4 mEq/L (ref 3.5–5.1)
Sodium: 135 mEq/L (ref 135–145)
Total Bilirubin: 0.5 mg/dL (ref 0.2–1.2)
Total Protein: 8.2 g/dL (ref 6.0–8.3)

## 2020-02-29 LAB — CBC
HCT: 47.7 % (ref 39.0–52.0)
Hemoglobin: 16.5 g/dL (ref 13.0–17.0)
MCHC: 34.6 g/dL (ref 30.0–36.0)
MCV: 84.7 fl (ref 78.0–100.0)
Platelets: 385 10*3/uL (ref 150.0–400.0)
RBC: 5.63 Mil/uL (ref 4.22–5.81)
RDW: 13.2 % (ref 11.5–15.5)
WBC: 10.6 10*3/uL — ABNORMAL HIGH (ref 4.0–10.5)

## 2020-02-29 LAB — LIPASE: Lipase: 41 U/L (ref 11.0–59.0)

## 2020-02-29 LAB — AMYLASE: Amylase: 21 U/L — ABNORMAL LOW (ref 27–131)

## 2020-03-14 ENCOUNTER — Encounter: Payer: Self-pay | Admitting: Gastroenterology

## 2020-03-28 ENCOUNTER — Encounter: Payer: Self-pay | Admitting: Gastroenterology

## 2020-03-28 ENCOUNTER — Ambulatory Visit: Payer: BC Managed Care – PPO | Admitting: Gastroenterology

## 2020-03-28 VITALS — BP 144/96 | HR 94 | Ht 63.0 in | Wt 178.0 lb

## 2020-03-28 DIAGNOSIS — R1011 Right upper quadrant pain: Secondary | ICD-10-CM | POA: Diagnosis not present

## 2020-03-28 DIAGNOSIS — Z800 Family history of malignant neoplasm of digestive organs: Secondary | ICD-10-CM | POA: Diagnosis not present

## 2020-03-28 DIAGNOSIS — R197 Diarrhea, unspecified: Secondary | ICD-10-CM

## 2020-03-28 DIAGNOSIS — R194 Change in bowel habit: Secondary | ICD-10-CM | POA: Diagnosis not present

## 2020-03-28 DIAGNOSIS — K760 Fatty (change of) liver, not elsewhere classified: Secondary | ICD-10-CM

## 2020-03-28 MED ORDER — CLENPIQ 10-3.5-12 MG-GM -GM/160ML PO SOLN
1.0000 | ORAL | 0 refills | Status: DC
Start: 2020-03-28 — End: 2020-05-01

## 2020-03-28 NOTE — Patient Instructions (Addendum)
If you are age 35 or older, your body mass index should be between 23-30. Your Body mass index is 31.53 kg/m. If this is out of the aforementioned range listed, please consider follow up with your Primary Care Provider.  If you are age 25 or younger, your body mass index should be between 19-25. Your Body mass index is 31.53 kg/m. If this is out of the aformentioned range listed, please consider follow up with your Primary Care Provider.   Please go to the 2nd floor of this building today and schedule your labwork, [address], [address].   You have been scheduled for a HIDA scan at [facility] (1st floor) on 04/25/2020. Please arrive 15 minutes prior to your scheduled appointment at  [id number] Make certain not to have anything to eat or drink at least 6 hours prior to your test. Should this appointment date or time not work well for you, please call radiology scheduling at [phone].  _____________________________________________________________________ hepatobiliary (HIDA) scan is an imaging procedure used to diagnose problems in the liver, gallbladder and bile ducts. In the HIDA scan, a radioactive chemical or tracer is injected into a vein in your arm. The tracer is handled by the liver like bile. Bile is a fluid produced and excreted by your liver that helps your digestive system break down fats in the foods you eat. Bile is stored in your gallbladder and the gallbladder releases the bile when you eat a meal. A special nuclear medicine scanner (gamma camera) tracks the flow of the tracer from your liver into your gallbladder and small intestine.  During your HIDA scan  You'll be asked to change into a hospital gown before your HIDA scan begins. Your health care team will position you on a table, usually on your back. The radioactive tracer is then injected into a vein in your arm.The tracer travels through your bloodstream to your liver, where it's taken up by the bile-producing  cells. The radioactive tracer travels with the bile from your liver into your gallbladder and through your bile ducts to your small intestine.You may feel some pressure while the radioactive tracer is injected into your vein. As you lie on the table, a special gamma camera is positioned over your abdomen taking pictures of the tracer as it moves through your body. The gamma camera takes pictures continually for about an hour. You'll need to keep still during the HIDA scan. This can become uncomfortable, but you may find that you can lessen the discomfort by taking deep breaths and thinking about other things. Tell your health care team if you're uncomfortable. The radiologist will watch on a computer the progress of the radioactive tracer through your body. The HIDA scan may be stopped when the radioactive tracer is seen in the gallbladder and enters your small intestine. This typically takes about an hour. In some cases extra imaging will be performed if original images aren't satisfactory, if morphine is given to help visualize the gallbladder or if the medication CCK is given to look at the contraction of the gallbladder. This test typically takes 2 hours to complete. ________________________________________________________________________  It is recommended that you start a fiber supplement,  Due to recent changes in healthcare laws, you may see the results of your imaging and laboratory studies on MyChart before your provider has had a chance to review them.  We understand that in some cases there may be results that are confusing or concerning to you. Not all laboratory results  come back in the same time frame and the provider may be waiting for multiple results in order to interpret others.  Please give [place] 48 hours in order for your provider to thoroughly review all the results before contacting the office for clarification of your results.   Thank you for choosing me and [facility].  [name] Cirigliano, D.O.

## 2020-03-28 NOTE — Progress Notes (Signed)
Chief Complaint: RUQ pain, diarrhea, change in bowel habits  Referring Provider:     [name], MD   HPI:     Christopher Nguyen is a 35 y.o. male with a history of HTN referred to the Gastroenterology Clinic for evaluation of RUQ pain, change in bowel habits, nonbloody diarrhea.  He states he has had RUQ pain since at least 07/2019. Typically post prandial RUQ pain and also post prandial BM. Sxs intermittent. No radiation, n/v/f/c.  Rare episodes that wake him from sleep. No improvement with trial of Prilosec.  -RUQ [place] (08/2019): Fatty liver, otherwise normal -08/2019: WBC 11.5, otherwise normal CBC, CMP, acute viral hep panel -02/2020: WBC 10.6, otherwise normal CBC, CMP, amylase, lipase  Also with associated change in bowel habits described as 6-7 loose/watery, nonbloody stools per day, typically lasting 1-2 weeks, then reverts back to baseline of 1 BM/day.  Change in bowel habits tends to be associated with RUQ pain, but not always.  Symptoms occur every 8 weeks or so.  +urgency. +mucus-like stools. No nocturnal stools. No change in RUQ pain or change in bowel habits with diet diary and avoiding fatty foods, caffeine, dairy, etc. Otherwise, weight stable, no night sweats.  No previous EGD or colonoscopy.  Family history notable for father with colon cancer, diagnosed in his 46s and died age 67.   Past Medical History:  Diagnosis Date  . 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 Hypertension   . Low testosterone      Past Surgical History:  Procedure Laterality Date  . CLUB FOOT RELEASE  infant  . TESTICLE BIOPSY Bilateral 01/28/2015   Procedure: BIOPSY TESTICULAR;  Surgeon: Carolan Clines, MD;  Location: [facility];  Service: Urology;  Laterality: Bilateral;   Family History  Problem Relation Age of Onset  . Bladder Cancer Mother   . Ovarian cancer Mother   . Colon cancer Father   . Hypertension Father   . Hypertension Brother   . Melanoma  Brother   . Alcoholism Maternal Grandmother   . Alcohol abuse Maternal Grandfather   . Alcoholism Maternal Grandfather   . Esophageal cancer Neg Hx    Social History   Tobacco Use  . Smoking status: Never Smoker  . Smokeless tobacco: Never Used  Vaping Use  . Vaping Use: Never used  Substance Use Topics  . Alcohol use: Yes    Comment: weekly  . Drug use: No   Current Outpatient Medications  Medication Sig Dispense Refill  . lisinopril (ZESTRIL) 10 MG tablet TAKE 1 TABLET(10 MG) BY MOUTH DAILY 90 tablet 3  . Multiple Vitamin (MULTIVITAMIN) tablet Take 1 tablet by mouth daily.    Marland Kitchen testosterone cypionate (DEPOTESTOSTERONE CYPIONATE) 200 MG/ML injection Inject 100 mg into the muscle every 7 (seven) days.     No current facility-administered medications for this visit.   Allergies  Allergen Reactions  . Cephalosporins Other (See Comments)    Unknown childhood reaction     Review of Systems: All systems reviewed and negative except where noted in HPI.     Physical Exam:    Wt Readings from Last 3 Encounters:  03/28/20 178 lb (80.7 kg)  02/28/20 178 lb (80.7 kg)  09/06/19 179 lb (81.2 kg)    BP (!) 144/96   Pulse 94   Ht $R'5\' 3"'QU$  (1.6 m)   Wt 178 lb (80.7 kg)   BMI 31.53 kg/m  Constitutional:  Pleasant, in no acute distress. Psychiatric: Normal mood and affect. Behavior is normal. EENT: Pupils normal.  Conjunctivae are normal. No scleral icterus. Neck supple. No cervical LAD. Cardiovascular: Normal rate, regular rhythm. No edema Pulmonary/chest: Effort normal and breath sounds normal. No wheezing, rales or rhonchi. Abdominal: Soft, nondistended, nontender. Bowel sounds active throughout. There are no masses palpable. No hepatomegaly. Neurological: Alert and oriented to person place and time. Skin: Skin is warm and dry. No rashes noted.   ASSESSMENT AND PLAN;   1) RUQ pain 2) Change in bowel habits 3) Diarrhea 4) Family history of colon cancer  - EGD with  biopsies along with colonoscopy with random and directed biopsies - HIDA scan - tTG, IgA - ESR, CRP, Calprotectin, GI PCR panel - Fiber supplement  5) Fatty liver disease -Steatosis noted on RUQ [place].  Otherwise normal liver enzymes -Discussed fatty liver infiltration at length today -Diet/exercise with goal of modest 10% weight loss done slowly over weeks/months  The indications, risks, and benefits of EGD and colonoscopy were explained to the patient in detail. Risks include but are not limited to bleeding, perforation, adverse reaction to medications, and cardiopulmonary compromise. Sequelae include but are not limited to the possibility of surgery, hospitalization, and mortality. The patient verbalized understanding and wished to proceed. All questions answered, referred to scheduler and bowel prep ordered. Further recommendations pending results of the exam.      Lavena Bullion, DO, FACG  03/28/2020, 11:07 AM   Copland, Gay Filler, MD

## 2020-03-29 ENCOUNTER — Other Ambulatory Visit: Payer: Self-pay | Admitting: *Deleted

## 2020-03-29 DIAGNOSIS — I10 Essential (primary) hypertension: Secondary | ICD-10-CM

## 2020-03-29 MED ORDER — LISINOPRIL 10 MG PO TABS: ORAL_TABLET | ORAL | 1 refills | Status: DC

## 2020-04-08 DIAGNOSIS — R1011 Right upper quadrant pain: Secondary | ICD-10-CM

## 2020-04-17 ENCOUNTER — Encounter: Payer: Self-pay | Admitting: Gastroenterology

## 2020-04-19 ENCOUNTER — Other Ambulatory Visit (INDEPENDENT_AMBULATORY_CARE_PROVIDER_SITE_OTHER): Payer: BC Managed Care – PPO

## 2020-04-19 ENCOUNTER — Other Ambulatory Visit: Payer: Self-pay

## 2020-04-19 DIAGNOSIS — R197 Diarrhea, unspecified: Secondary | ICD-10-CM

## 2020-04-19 DIAGNOSIS — R194 Change in bowel habit: Secondary | ICD-10-CM

## 2020-04-19 DIAGNOSIS — K760 Fatty (change of) liver, not elsewhere classified: Secondary | ICD-10-CM | POA: Diagnosis not present

## 2020-04-19 DIAGNOSIS — R1011 Right upper quadrant pain: Secondary | ICD-10-CM

## 2020-04-19 NOTE — Addendum Note (Signed)
Addended by: Kelle Darting A on: 04/19/2020 03:39 PM   Modules accepted: Orders

## 2020-04-22 ENCOUNTER — Other Ambulatory Visit: Payer: BC Managed Care – PPO

## 2020-04-22 ENCOUNTER — Other Ambulatory Visit: Payer: Self-pay

## 2020-04-22 DIAGNOSIS — R1011 Right upper quadrant pain: Secondary | ICD-10-CM | POA: Diagnosis not present

## 2020-04-22 DIAGNOSIS — K760 Fatty (change of) liver, not elsewhere classified: Secondary | ICD-10-CM

## 2020-04-22 DIAGNOSIS — R197 Diarrhea, unspecified: Secondary | ICD-10-CM | POA: Diagnosis not present

## 2020-04-22 DIAGNOSIS — R194 Change in bowel habit: Secondary | ICD-10-CM | POA: Diagnosis not present

## 2020-04-22 LAB — SEDIMENTATION RATE: Sed Rate: 2 mm/h (ref 0–15)

## 2020-04-22 LAB — IGA: Immunoglobulin A: 195 mg/dL (ref 47–310)

## 2020-04-22 LAB — C-REACTIVE PROTEIN: CRP: 7.3 mg/L (ref <8.0)

## 2020-04-22 LAB — TISSUE TRANSGLUTAMINASE, IGA: (tTG) Ab, IgA: <1 U/mL

## 2020-04-22 NOTE — Addendum Note (Signed)
Addended by: Manuela Schwartz on: 04/22/2020 10:58 AM   Modules accepted: Orders

## 2020-04-22 NOTE — Addendum Note (Signed)
Addended by: Manuela Schwartz on: 04/22/2020 10:25 AM   Modules accepted: Orders

## 2020-04-22 NOTE — Addendum Note (Signed)
Addended by: Manuela Schwartz on: 04/22/2020 10:18 AM   Modules accepted: Orders

## 2020-04-24 LAB — CALPROTECTIN, FECAL: Calprotectin, Fecal: 915 ug/g — ABNORMAL HIGH (ref 0–120)

## 2020-04-25 ENCOUNTER — Ambulatory Visit (HOSPITAL_COMMUNITY)
Admission: RE | Admit: 2020-04-25 | Discharge: 2020-04-25 | Disposition: A | Discharge status: Home / Self Care | Payer: BC Managed Care – PPO | Source: Ambulatory Visit | Attending: Gastroenterology | Admitting: Gastroenterology

## 2020-04-25 ENCOUNTER — Other Ambulatory Visit: Payer: Self-pay

## 2020-04-25 DIAGNOSIS — R197 Diarrhea, unspecified: Secondary | ICD-10-CM | POA: Diagnosis not present

## 2020-04-25 DIAGNOSIS — R194 Change in bowel habit: Secondary | ICD-10-CM | POA: Diagnosis not present

## 2020-04-25 DIAGNOSIS — R1011 Right upper quadrant pain: Secondary | ICD-10-CM | POA: Diagnosis not present

## 2020-04-25 DIAGNOSIS — K760 Fatty (change of) liver, not elsewhere classified: Secondary | ICD-10-CM | POA: Diagnosis not present

## 2020-04-25 LAB — GI PROFILE, STOOL, PCR

## 2020-04-25 MED ORDER — TECHNETIUM TC 99M MEBROFENIN IV KIT
5.5000 | PACK | Freq: Once | INTRAVENOUS | Status: AC
  Administered 2020-04-25: 5.5 via INTRAVENOUS

## 2020-04-26 ENCOUNTER — Telehealth: Payer: Self-pay | Admitting: General Surgery

## 2020-04-26 ENCOUNTER — Other Ambulatory Visit: Payer: Self-pay | Admitting: Gastroenterology

## 2020-04-26 MED ORDER — DICYCLOMINE HCL 10 MG PO CAPS: 10.0000 mg | ORAL_CAPSULE | Freq: Three times a day (TID) | ORAL | 0 refills | Status: DC

## 2020-04-26 NOTE — Telephone Encounter (Signed)
Notified the patient that Dr Bryan Lemma received his results for his HIDA scan and it was abnormal. We are referring him to [facility] to see a surgeon for a possible cholecystectomy. The patient verbalized understanding. Expressed if he does not hear from [facility] within 1-2 weeks to please contact our office back

## 2020-05-01 ENCOUNTER — Ambulatory Visit (AMBULATORY_SURGERY_CENTER): Payer: BC Managed Care – PPO | Admitting: Gastroenterology

## 2020-05-01 ENCOUNTER — Other Ambulatory Visit: Payer: Self-pay

## 2020-05-01 ENCOUNTER — Encounter: Payer: Self-pay | Admitting: Gastroenterology

## 2020-05-01 VITALS — BP 151/101 | HR 88 | Temp 99.5°F | Resp 16 | Ht 63.0 in | Wt 178.0 lb

## 2020-05-01 DIAGNOSIS — R197 Diarrhea, unspecified: Secondary | ICD-10-CM | POA: Diagnosis not present

## 2020-05-01 DIAGNOSIS — K2970 Gastritis, unspecified, without bleeding: Secondary | ICD-10-CM

## 2020-05-01 DIAGNOSIS — R1011 Right upper quadrant pain: Secondary | ICD-10-CM

## 2020-05-01 DIAGNOSIS — K529 Noninfective gastroenteritis and colitis, unspecified: Secondary | ICD-10-CM

## 2020-05-01 DIAGNOSIS — K3189 Other diseases of stomach and duodenum: Secondary | ICD-10-CM

## 2020-05-01 DIAGNOSIS — K50812 Crohn's disease of both small and large intestine with intestinal obstruction: Secondary | ICD-10-CM | POA: Diagnosis not present

## 2020-05-01 DIAGNOSIS — R194 Change in bowel habit: Secondary | ICD-10-CM

## 2020-05-01 MED ORDER — MESALAMINE 1.2 G PO TBEC
DELAYED_RELEASE_TABLET | ORAL | 3 refills | Status: DC
Start: 2020-05-01 — End: 2020-05-01

## 2020-05-01 MED ORDER — MESALAMINE 1.2 G PO TBEC: 1.2000 g | DELAYED_RELEASE_TABLET | Freq: Every day | ORAL | 3 refills | Status: DC

## 2020-05-01 MED ORDER — SODIUM CHLORIDE 0.9 % IV SOLN
500.0000 mL | Freq: Once | INTRAVENOUS | Status: DC
Start: 2020-05-01 — End: 2020-05-01

## 2020-05-01 NOTE — Progress Notes (Signed)
Called to room to assist during endoscopic procedure.  Patient ID and intended procedure confirmed with present staff. Received instructions for my participation in the procedure from the performing physician.  

## 2020-05-01 NOTE — Progress Notes (Signed)
Report to PACU, RN, vss, BBS= Clear.  

## 2020-05-01 NOTE — Op Note (Signed)
[name] Procedure Date: 05/01/2020 3:39 PM MRN: [id number] Endoscopist: Gerrit Heck , MD Age: 35 Referring MD:  Date of Birth: June 08, 1985 Gender: Male Account #: [id number] Procedure:                Upper GI endoscopy Indications:              Abdominal pain in the right upper quadrant, Diarrhea Medicines:                Monitored Anesthesia Care Procedure:                Pre-Anesthesia Assessment:                           - Prior to the procedure, a History and Physical                            was performed, and patient medications and                            allergies were reviewed. The patient's tolerance of                            previous anesthesia was also reviewed. The risks                            and benefits of the procedure and the sedation                            options and risks were discussed with the patient.                            All questions were answered, and informed consent                            was obtained. Prior Anticoagulants: The patient has                            taken no previous anticoagulant or antiplatelet                            agents. ASA Grade Assessment: II - A patient with                            mild systemic disease. After reviewing the risks                            and benefits, the patient was deemed in                            satisfactory condition to undergo the procedure.                           After obtaining informed consent, the endoscope was  passed under direct vision. Throughout the                            procedure, the patient's blood pressure, pulse, and                            oxygen saturations were monitored continuously. The                            Endoscope was introduced through the mouth, and                            advanced to the second part of duodenum. The upper                            GI  endoscopy was accomplished without difficulty.                            The patient tolerated the procedure well. Scope In: Scope Out: Findings:                 The examined esophagus was normal.                           A single 6 mm submucosal nodule with no bleeding                            was found in the gastric antrum. There appeared to                            be a +pillow sign with closed forceps. The                            overlying mucosa was otherwise normal appearing.                            Tunnelled biopsies were taken with a cold forceps                            for histology. Estimated blood loss was minimal.                           The visualized mucosa was otherwise normal                            throughout the stomach. Biopsies were taken with a                            cold forceps for Helicobacter pylori testing.                            Estimated blood loss was minimal.  The examined duodenum was normal. Biopsies were                            taken with a cold forceps for histology. Estimated                            blood loss was minimal. Complications:            No immediate complications. Estimated Blood Loss:     Estimated blood loss was minimal. Impression:               - Normal esophagus.                           - A single submucosal nodule found in the stomach.                            Biopsied.                           - Normal mucosa was found in the entire stomach.                            Biopsied.                           - Normal examined duodenum. Biopsied. Recommendation:           - Patient has a contact number available for                            emergencies. The signs and symptoms of potential                            delayed complications were discussed with the                            patient. Return to normal activities tomorrow.                            Written  discharge instructions were provided to the                            patient.                           - Resume previous diet.                           - Continue present medications.                           - Await pathology results.                           - Perform a colonoscopy today. Gerrit Heck, MD 05/01/2020 4:21:58 PM

## 2020-05-01 NOTE — Patient Instructions (Signed)
Discharge instructions given. Prescription sent to pharmacy. Check BMP in 2 weeks after starting medications. Follow up with Dr. Bryan Lemma in the GI clinic in 2-4 weeks. Referral to General Surgery for evaluation of biliary dyskinesia noted on HIDA. Resume previous medications. YOU HAD AN ENDOSCOPIC PROCEDURE TODAY AT [facility]:   Refer to the procedure report that was given to you for any specific questions about what was found during the examination.  If the procedure report does not answer your questions, please call your gastroenterologist to clarify.  If you requested that your care partner not be given the details of your procedure findings, then the procedure report has been included in a sealed envelope for you to review at your convenience later.  YOU SHOULD EXPECT: Some feelings of bloating in the abdomen. Passage of more gas than usual.  Walking can help get rid of the air that was put into your GI tract during the procedure and reduce the bloating. If you had a lower endoscopy (such as a colonoscopy or flexible sigmoidoscopy) you may notice spotting of blood in your stool or on the toilet paper. If you underwent a bowel prep for your procedure, you may not have a normal bowel movement for a few days.  Please Note:  You might notice some irritation and congestion in your nose or some drainage.  This is from the oxygen used during your procedure.  There is no need for concern and it should clear up in a day or so.  SYMPTOMS TO REPORT IMMEDIATELY:   Following lower endoscopy (colonoscopy or flexible sigmoidoscopy):  Excessive amounts of blood in the stool  Significant tenderness or worsening of abdominal pains  Swelling of the abdomen that is new, acute  Fever of 100F or higher   Following upper endoscopy (EGD)  Vomiting of blood or coffee ground material  New chest pain or pain under the shoulder blades  Painful or persistently difficult swallowing  New  shortness of breath  Fever of 100F or higher  Black, tarry-looking stools  For urgent or emergent issues, a gastroenterologist can be reached at any hour by calling [phone]. Do not use MyChart messaging for urgent concerns.    DIET:  We do recommend a small meal at first, but then you may proceed to your regular diet.  Drink plenty of fluids but you should avoid alcoholic beverages for 24 hours.  ACTIVITY:  You should plan to take it easy for the rest of today and you should NOT DRIVE or use heavy machinery until tomorrow (because of the sedation medicines used during the test).    FOLLOW UP: Our staff will call the number listed on your records 48-72 hours following your procedure to check on you and address any questions or concerns that you may have regarding the information given to you following your procedure. If we do not reach you, we will leave a message.  We will attempt to reach you two times.  During this call, we will ask if you have developed any symptoms of COVID 19. If you develop any symptoms (ie: fever, flu-like symptoms, shortness of breath, cough etc.) before then, please call [phone].  If you test positive for Covid 19 in the 2 weeks post procedure, please call and report this information to [place].    If any biopsies were taken you will be contacted by phone or by letter within the next 1-3 weeks.  Please call us at [phone] if you have  not heard about the biopsies in 3 weeks.    SIGNATURES/CONFIDENTIALITY: You and/or your care partner have signed paperwork which will be entered into your electronic medical record.  These signatures attest to the fact that that the information above on your After Visit Summary has been reviewed and is understood.  Full responsibility of the confidentiality of this discharge information lies with you and/or your care-partner.

## 2020-05-01 NOTE — Op Note (Signed)
Christopher Nguyen: Christopher Nguyen Procedure Date: 05/01/2020 3:38 PM MRN: [id number] Endoscopist: Gerrit Heck , MD Age: 35 Referring MD:  Date of Birth: [birth year] Gender: Male Account #: [id number] Procedure:                Colonoscopy Indications:              Abdominal pain in the right upper quadrant, Change                            in bowel habits, Diarrhea, Fecal urgency,                            mucus-like stools.                           Evaluation notable for elevated fecal calprotectin,                            with otherwise normal ESR, CRP, CBC, and negative                            GI PCR panel. Medicines:                Monitored Anesthesia Care Procedure:                Pre-Anesthesia Assessment:                           - Prior to the procedure, a History and Physical                            was performed, and patient medications and                            allergies were reviewed. The patient's tolerance of                            previous anesthesia was also reviewed. The risks                            and benefits of the procedure and the sedation                            options and risks were discussed with the patient.                            All questions were answered, and informed consent                            was obtained. Prior Anticoagulants: The patient has                            taken no previous anticoagulant or antiplatelet  agents. ASA Grade Assessment: II - A patient with                            mild systemic disease. After reviewing the risks                            and benefits, the patient was deemed in                            satisfactory condition to undergo the procedure.                           After obtaining informed consent, the colonoscope                            was passed under direct vision. Throughout the                            procedure,  the patient's blood pressure, pulse, and                            oxygen saturations were monitored continuously. The                            Olympus CF-HQ190 (#[id number]) Colonoscope was                            introduced through the anus and advanced to the the                            terminal ileum. The colonoscopy was performed                            without difficulty. The patient tolerated the                            procedure well. The quality of the bowel                            preparation was good. The terminal ileum, ileocecal                            valve, appendiceal orifice, and rectum were                            photographed. Scope In: 3:58:03 PM Scope Out: 4:13:30 PM Scope Withdrawal Time: 0 hours 13 minutes 16 seconds  Total Procedure Duration: 0 hours 15 minutes 27 seconds  Findings:                 The perianal and digital rectal examinations were                            normal.  Inflammation characterized by altered vascularity,                            congestion (edema), erythema, and aphthous                            ulcerations was found in a continuous and                            circumferential pattern from the rectum to the                            cecum. There were patches of normal appearing                            mucosa throughout the colon. The rectum appeared to                            be spared, with transition to normal mucosa at 18                            cm from the anal verge. The inflammation was graded                            as mild in severity. Several mucosal biopsies were                            taken throughout the colon with a cold forceps for                            histology. Estimated blood loss was minimal.                           The retroflexed view of the distal rectum and anal                            verge was normal and showed no anal or rectal                             abnormalities.                           The terminal ileum appeared normal. Complications:            No immediate complications. Estimated Blood Loss:     Estimated blood loss was minimal. Impression:               - Pancolitis, with largely continuous inflammation                            was found from the recto-sigmoid (at 18 cm from the                            anal verge) to the cecum. There were  scattered                            patches of normal appearing mucosa, but suspect                            that to be variable degrees of inflammation rather                            than true skip areas. This was graded as mild in                            severity. The colon was extensively biopsied.                           - The rectum otherwise appeared spared. This was                            biopsied.                           - The distal rectum and anal verge are normal on                            retroflexion view.                           - The examined portion of the ileum was normal. Recommendation:           - Patient has a contact number available for                            emergencies. The signs and symptoms of potential                            delayed complications were discussed with the                            patient. Return to normal activities tomorrow.                            Written discharge instructions were provided to the                            patient.                           - Resume previous diet.                           - Continue present medications.                           - Await pathology results.                           -  Use Lialda 1.2 gm at 4 tabs PO daily for 8 weeks,                            then reduce to 2 tabs daily.                           - Check BMP in 2 weeks after starting medications.                           - Follow-up with me in the GI clinic in 2-4 weeks.                            - Referral to General Surgery for evaluation of                            biliary dyskinesia noted on HIDA. Gerrit Heck, MD 05/01/2020 4:33:16 PM

## 2020-05-01 NOTE — Progress Notes (Signed)
Vitals-SH ° °History reviewed. °

## 2020-05-02 ENCOUNTER — Telehealth: Payer: Self-pay | Admitting: General Surgery

## 2020-05-02 NOTE — Telephone Encounter (Signed)
Per fax received from [facility] the patient declined an appointment with them.

## 2020-05-03 ENCOUNTER — Telehealth: Payer: Self-pay

## 2020-05-03 NOTE — Telephone Encounter (Signed)
LVM

## 2020-05-07 ENCOUNTER — Telehealth: Payer: Self-pay

## 2020-05-07 ENCOUNTER — Telehealth: Payer: Self-pay | Admitting: General Surgery

## 2020-05-07 DIAGNOSIS — R1011 Right upper quadrant pain: Secondary | ICD-10-CM

## 2020-05-07 DIAGNOSIS — K760 Fatty (change of) liver, not elsewhere classified: Secondary | ICD-10-CM

## 2020-05-07 NOTE — Telephone Encounter (Signed)
Per 05/01/20 procedure note - Check BMP 2 weeks after starting medication, follow up in 2-4 weeks, referral to general surgery  Lab order in epic.   Patient is scheduled for a follow up on Wednesday, 05/22/20 at 10:20 AM.   Referral, records, demographic and insurance information faxed to [facility].   Spoke with patient in regards to recommendations. Patient is aware that once he begins the [place] he will need to come in 2 weeks later for lab work, he is aware that we will let him know if Dr. Loletha Grayer decides to send in an alternative medication. He is aware of follow up appt. He is aware that referral has been faxed to [facility], advised to give them a week to give him a call to set up an appt but if he has not heard anything to give [place] a call back. Patient verbalized understanding and had no concerns at the end of the call.

## 2020-05-07 NOTE — Telephone Encounter (Cosign Needed)
Spoke with the patient in regards to him declining to see a surgeon at [facility] for a cholecystectomy. The patient stated he wanted to address his colitis first. He didn't feel he was having anymore gallbladder symptoms and will call if he changes his mind.

## 2020-05-07 NOTE — Telephone Encounter (Signed)
Contacted the patient after receiving a mychart message regarding his Lialda. He states that he is having a hard time swallowing this medication due to a bad gag reflex. He desires another medication smaller in size to take. Explained to him after discussion with Dr Bryan Lemma the only other alternative would be a biologic. The patient is not interested in trying a biologic medication.  He states he know that Lialda has generic medications and he will check with his pharmacy to find a smaller pill. The patient will call me back

## 2020-05-08 MED ORDER — MESALAMINE 800 MG PO TBEC: 800.0000 mg | DELAYED_RELEASE_TABLET | Freq: Three times a day (TID) | ORAL | 5 refills | Status: DC

## 2020-05-08 NOTE — Telephone Encounter (Signed)
Per Dr Bryan Lemma- Since Asacol HD is slightly different from [place]ke, the dose and frequency is different as well. Daily dose will be 2.4 gm split into 3 doses, NOT 4.8 gm at once like the Lialda is. This should be prescribed as Asacol HD 800 mg PO TID. 3 month supply with RF5. Thanks.

## 2020-05-22 ENCOUNTER — Other Ambulatory Visit: Payer: Self-pay | Admitting: General Surgery

## 2020-05-22 ENCOUNTER — Ambulatory Visit (INDEPENDENT_AMBULATORY_CARE_PROVIDER_SITE_OTHER): Payer: BC Managed Care – PPO | Admitting: Gastroenterology

## 2020-05-22 ENCOUNTER — Other Ambulatory Visit (INDEPENDENT_AMBULATORY_CARE_PROVIDER_SITE_OTHER): Payer: BC Managed Care – PPO

## 2020-05-22 ENCOUNTER — Encounter: Payer: Self-pay | Admitting: Gastroenterology

## 2020-05-22 ENCOUNTER — Telehealth: Payer: Self-pay | Admitting: General Surgery

## 2020-05-22 ENCOUNTER — Other Ambulatory Visit: Payer: Self-pay

## 2020-05-22 VITALS — BP 130/90 | HR 98 | Ht 63.0 in | Wt 169.0 lb

## 2020-05-22 DIAGNOSIS — K828 Other specified diseases of gallbladder: Secondary | ICD-10-CM

## 2020-05-22 DIAGNOSIS — K50119 Crohn's disease of large intestine with unspecified complications: Secondary | ICD-10-CM

## 2020-05-22 DIAGNOSIS — K760 Fatty (change of) liver, not elsewhere classified: Secondary | ICD-10-CM

## 2020-05-22 DIAGNOSIS — R1011 Right upper quadrant pain: Secondary | ICD-10-CM | POA: Diagnosis not present

## 2020-05-22 DIAGNOSIS — K3189 Other diseases of stomach and duodenum: Secondary | ICD-10-CM

## 2020-05-22 DIAGNOSIS — Z800 Family history of malignant neoplasm of digestive organs: Secondary | ICD-10-CM

## 2020-05-22 LAB — BASIC METABOLIC PANEL
BUN: 13 mg/dL (ref 6–23)
CO2: 27 mEq/L (ref 19–32)
Calcium: 9.9 mg/dL (ref 8.4–10.5)
Chloride: 100 mEq/L (ref 96–112)
Creatinine, Ser: 1.13 mg/dL (ref 0.40–1.50)
GFR: 84.4 mL/min (ref >60.00)
Glucose, Bld: 107 mg/dL — ABNORMAL HIGH (ref 70–99)
Potassium: 4.7 mEq/L (ref 3.5–5.1)
Sodium: 136 mEq/L (ref 135–145)

## 2020-05-22 NOTE — Telephone Encounter (Signed)
-----   Message from Lavena Bullion, DO sent at 05/22/2020  4:29 PM EDT ----- Chemistry panel is normal, demonstrating normal renal function normal electrolytes.  Okay to resume medication as prescribed.  Repeat in 8 weeks.

## 2020-05-22 NOTE — Progress Notes (Signed)
P  Chief Complaint:    Newly diagnosed Crohn's Colitis, procedure follow-up  GI History: 82 male with history of HTN, initially seen in the GI clinic on 03/28/2020 for evaluation of RUQ pain, change in bowel habits, nonbloody diarrhea.  1) RUQ pain, biliary dyskinesia: Started 07/2019. Typically post prandial RUQ pain and also post prandial BM. Sxs intermittent. No radiation, n/v/f/c.  Rare episodes that wake him from sleep. No improvement with trial of Prilosec.  -RUQ [place] (08/2019): Fatty liver, otherwise normal -08/2019: WBC 11.5, otherwise normal CBC, CMP, acute viral hep panel -02/2020: WBC 10.6, otherwise normal CBC, CMP, amylase, lipase -04/2020: Normal tTG -04/2020: HIDA scan: EF 8% consistent with biliary dyskinesia/chronic cholecystitis.  Referred to General Surgery, patient declined referral  2) Change in bowel habits: 6-7 loose/watery, nonbloody stools per day, typically lasting 1-2 weeks, then reverts back to baseline of 1 BM/day.  Change in bowel habits tends to be associated with RUQ pain, but not always.  Symptoms occur every 8 weeks or so.  +urgency. +mucus-like stools. No nocturnal stools. -04/2020: Fecal calprotectin 915.  Normal ESR, CRP, celiac panel.  Negative GI PCR panel -Colonoscopy 04/2020 with mild patchy colitis from sigmoid to cecum with areas of normal mucosa.  Biopsies consistent with active, chronic colitis.  Relative rectal sparing, all suggestive of Crohn's colitis.  Normal TI.  Prescribed Lialda 4.8 g/day.  3) Family history of colon cancer: Father with colon cancer, diagnosed in his 58s and died at age 22  4) Hepatic steatosis: Incidentally noted on RUQ [place]  Endoscopic History: -EGD (05/01/2020, Dr. Bryan Lemma): 6 mm SEL in the antrum (path: Gastritis without dysplasia/metaplasia), Otherwise normal stomach (biopsies negative for H. pylori) and duodenum (biopsies: Increased IELs without villous flattening).  Repeat endoscopy in 1 year for surveillance of antral  nodule -Colonoscopy (05/01/2020, Dr. Bryan Lemma): Patchy mild colitis from sigmoid to cecum, with areas of normal mucosa (path: Moderate to severe active, chronic colitis).  Rectal sparing, with transition to normal mucosa at 18 cm.  Normal TI.  HPI:     Patient is a 35 y.o. male presenting to the Gastroenterology Clinic for procedure follow-up.  Initially seen by me on 03/28/2020 as outlined above.  Diagnosed with Crohn's Colitis on colonoscopy last month.  Was prescribed Lialda, but patient was concerned about the size of tablet, so changed to Asacol HD.  Today, states he generally feels better since last appt. Started taking Asacol HD, with reduced stool frequency, urgency, and consistency. Weight stabilizing now.  Good p.o. intake.    Review of systems:     No chest pain, no SOB, no fevers, no urinary sx   Past Medical History:  Diagnosis Date  . 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 Hypertension   . Low testosterone     Patient's surgical history, family medical history, social history, medications and allergies were all reviewed in Epic    Current Outpatient Medications  Medication Sig Dispense Refill  . dicyclomine (BENTYL) 10 MG capsule Take 1 capsule (10 mg total) by mouth 3 (three) times daily with meals. 90 capsule 0  . lisinopril (ZESTRIL) 10 MG tablet TAKE 1 TABLET(10 MG) BY MOUTH DAILY 90 tablet 1  . Mesalamine (ASACOL HD) 800 MG TBEC Take 1 tablet (800 mg total) by mouth in the morning, at noon, and at bedtime. 270 tablet 5  . Multiple Vitamin (MULTIVITAMIN) tablet Take 1 tablet by mouth daily.    Marland Kitchen testosterone cypionate (DEPOTESTOSTERONE CYPIONATE) 200 MG/ML injection Inject 100  mg into the muscle every 7 (seven) days.     No current facility-administered medications for this visit.    Physical Exam:     There were no vitals taken for this visit.  GENERAL:  Pleasant male in NAD PSYCH: : Cooperative, normal affect EENT:  conjunctiva pink, mucous membranes moist,  neck supple without masses CARDIAC:  RRR, no murmur heard, no peripheral edema PULM: Normal respiratory effort, lungs CTA bilaterally, no wheezing ABDOMEN:  Nondistended, soft, nontender. No obvious masses, no hepatomegaly,  normal bowel sounds SKIN:  turgor, no lesions seen Musculoskeletal:  Normal muscle tone, normal strength NEURO: Alert and oriented x 3, no focal neurologic deficits   IMPRESSION and PLAN:    1) Crohns Colitis: 35 year old male with newly diagnosed Crohn's colitis.  Seems to be having good clinical response to 5-ASA therapy. - Resume Asacol HD as prescribed - Discussed pathophysiology and medical management of Crohn's disease at length today.  Hopefully continues and maintain remission on 5-ASA monotherapy.  Did discuss the potential for escalating therapy to Biologics/immunomodulators if unable to capture remission - Repeat Calprotectin in 3 months as surrogate marker of reponse/remission - Check BMP and if ok, recheck in 8 weeks -Colonoscopy 6 months to evaluate for mucosal/histologic remission -Upper endoscopy with normal-appearing duodenum, biopsies with increased IELs but without villous flattening.  Did discuss the possibility that that represents very early UGI Crohn's disease, but otherwise asymptomatic.  Plan to reevaluate at time of repeat upper endoscopy as outlined below.  Should he have UGI Crohn's, did discuss the possibility of escalating therapy based on endoscopic/histologic appearance and clinical symptoms  2) Biliary dyskinesia 3) Intermittent RUQ pain -HIDA scan with likely biliary dyskinesia/chronic cholecystitis.  Patient does not want to pursue referral to [facility] at this juncture.  Discussed diagnosis and risks today.  3) Hepatic steatosis -Incidentally noted at the time of RUQ [place].  Liver enzymes otherwise normal. -Conservative management with plan for modest weight loss of 10% body weight done slowly over weeks to months  4) Gastric subepithelial  nodule -Incidentally noted at time of upper endoscopy.  Path with reactive inflammatory changes, but no metaplasia or dysplasia -Per current guidelines, given size  <1 cm, plan for repeat upper endoscopy within 1 year to evaluate for size/appearance and stability.  If any increase in size or change in appearance, plan for EUS  5) Family history of colon cancer -Will have short interval screening/surveillance due to Crohn's colitis above along with family history  RTC in 6 months or sooner as needed     I spent 35 minutes of time, including in depth chart review, independent review of results as outlined above, communicating results with the patient directly, face-to-face time with the patient, coordinating care, and ordering studies and medications as appropriate, and documentation.       [place] ,DO, FACG 05/22/2020, 10:21 AM

## 2020-05-22 NOTE — Patient Instructions (Addendum)
If you are age 35 or older, your body mass index should be between 23-30. Your Body mass index is 29.94 kg/m. If this is out of the aforementioned range listed, please consider follow up with your Primary Care Provider.  If you are age 48 or younger, your body mass index should be between 19-25. Your Body mass index is 29.94 kg/m. If this is out of the aformentioned range listed, please consider follow up with your Primary Care Provider.   Please go to the 2nd floor of this building today and schedule your labwork, [address], [address].  You will need to follow up in 3 months at the lab for a stool test around 08/21/2020. Please pick up your collection containers in July 2022 and bring your sample back to the lab  Return to the clinic in 6 months which will be in October 2022. Call our office at [phone] to schedule.  Due to recent changes in healthcare laws, you may see the results of your imaging and laboratory studies on MyChart before your provider has had a chance to review them.  We understand that in some cases there may be results that are confusing or concerning to you. Not all laboratory results come back in the same time frame and the provider may be waiting for multiple results in order to interpret others.  Please give [place] 48 hours in order for your provider to thoroughly review all the results before contacting the office for clarification of your results.   Thank you for choosing me and [facility].  [name] Cirigliano, D.O.

## 2020-05-22 NOTE — Telephone Encounter (Signed)
Left the patient a detailed voicemail that his chemistry panel was normal, he may resume medications he also will need to have a repeat bmp this in 8 weeks, order was placed

## 2020-07-23 DIAGNOSIS — D225 Melanocytic nevi of trunk: Secondary | ICD-10-CM | POA: Diagnosis not present

## 2020-07-23 DIAGNOSIS — D485 Neoplasm of uncertain behavior of skin: Secondary | ICD-10-CM | POA: Diagnosis not present

## 2020-07-23 DIAGNOSIS — D235 Other benign neoplasm of skin of trunk: Secondary | ICD-10-CM | POA: Diagnosis not present

## 2020-07-25 DIAGNOSIS — E291 Testicular hypofunction: Secondary | ICD-10-CM | POA: Diagnosis not present

## 2020-07-29 DIAGNOSIS — E291 Testicular hypofunction: Secondary | ICD-10-CM | POA: Diagnosis not present

## 2020-08-01 DIAGNOSIS — E291 Testicular hypofunction: Secondary | ICD-10-CM | POA: Diagnosis not present

## 2020-09-04 ENCOUNTER — Other Ambulatory Visit: Payer: Self-pay

## 2020-09-04 ENCOUNTER — Other Ambulatory Visit: Payer: BC Managed Care – PPO

## 2020-09-04 DIAGNOSIS — K50119 Crohn's disease of large intestine with unspecified complications: Secondary | ICD-10-CM | POA: Diagnosis not present

## 2020-09-04 DIAGNOSIS — R1011 Right upper quadrant pain: Secondary | ICD-10-CM | POA: Diagnosis not present

## 2020-09-04 DIAGNOSIS — K828 Other specified diseases of gallbladder: Secondary | ICD-10-CM | POA: Diagnosis not present

## 2020-09-12 LAB — CALPROTECTIN: Calprotectin: 38 mcg/g

## 2020-11-09 DIAGNOSIS — K5090 Crohn's disease, unspecified, without complications: Secondary | ICD-10-CM | POA: Insufficient documentation

## 2020-11-09 NOTE — Progress Notes (Deleted)
[facility] [address] [place], [place] [address] [phone]  Date:  11/11/2020   Name:  Christopher Nguyen   DOB:  [birth year]   MRN:  [id number]  PCP:  [name], MD    Chief Complaint: No chief complaint on file.   History of Present Illness:  Christopher Nguyen is a 35 y.o. very pleasant male patient who presents with the following:  Patient seen today for follow-up.  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    Hypertension    Low testosterone     Past Surgical History:  Procedure Laterality Date   CLUB FOOT RELEASE  infant   TESTICLE BIOPSY Bilateral 01/28/2015   Procedure: BIOPSY TESTICULAR;  Surgeon: Carolan Clines, MD;  Location: [facility];  Service: Urology;  Laterality: Bilateral;    Social History   Tobacco Use   Smoking status: Never   Smokeless tobacco: Never  Vaping Use   Vaping Use: Never used  Substance Use Topics   Alcohol use: Yes    Comment: weekly   Drug use: No    Family History  Problem Relation Age of Onset   Bladder Cancer Mother    Colon cancer Father    Hypertension Father    Hypertension Brother    Melanoma Brother    Alcoholism Maternal Grandmother    Alcohol abuse Maternal Grandfather     Alcoholism Maternal Grandfather    Esophageal cancer Neg Hx    Rectal cancer Neg Hx    Stomach cancer Neg Hx     Allergies  Allergen Reactions   Cephalosporins Other (See Comments)    Unknown childhood reaction    Medication list has been reviewed and updated.  Current Outpatient Medications on File Prior to Visit  Medication Sig Dispense Refill   dicyclomine (BENTYL) 10 MG capsule Take 1 capsule (10 mg total) by mouth 3 (three) times daily with meals. 90 capsule 0   lisinopril (ZESTRIL) 10 MG tablet TAKE 1 TABLET(10 MG) BY MOUTH DAILY 90 tablet 1   Mesalamine (ASACOL HD) 800 MG TBEC Take 1 tablet (800 mg total) by mouth in the morning, at noon, and at bedtime. 270 tablet 5   Multiple Vitamin (MULTIVITAMIN) tablet Take 1 tablet by mouth daily.     testosterone cypionate (DEPOTESTOSTERONE CYPIONATE) 200 MG/ML injection Inject 100 mg into the muscle every 7 (seven) days.     No current facility-administered medications on file prior to visit.    Review of Systems:  As per HPI- otherwise negative.   Physical Examination: There were no vitals filed for this visit. There were no vitals filed for this visit. There is no height or weight on file to calculate BMI. Ideal Body Weight:    GEN: no acute distress. HEENT: Atraumatic, Normocephalic.  Ears and Nose: No external deformity. CV: RRR,  No M/G/R. No JVD. No thrill. No extra heart sounds. PULM: CTA B, no wheezes, crackles, rhonchi. No retractions. No resp. distress. No accessory muscle use. ABD: S, NT, ND, +BS. No rebound. No HSM. EXTR: No c/c/e PSYCH: Normally interactive. Conversant.    Assessment and Plan: ***  This visit occurred during the SARS-CoV-2 public health emergency.  Safety protocols were in place, including screening questions prior to the visit, additional usage of staff PPE, and extensive cleaning of exam room while observing appropriate contact time as indicated for disinfecting solutions.    Signed Lamar Blinks, MD

## 2020-11-11 ENCOUNTER — Encounter: Payer: BC Managed Care – PPO | Admitting: Family Medicine

## 2020-11-11 DIAGNOSIS — K5090 Crohn's disease, unspecified, without complications: Secondary | ICD-10-CM

## 2020-11-22 ENCOUNTER — Other Ambulatory Visit: Payer: Self-pay

## 2020-11-23 NOTE — Progress Notes (Addendum)
[facility] [place], [place], [place] [address] [phone]  Date:  11/25/2020   Name:  Christopher Nguyen   DOB:  1985/04/03   MRN:  [id number]  PCP:  [name], MD    Chief Complaint: Annual Exam (Concerns/ questions: none/Flu shot today: yes/HIV screen due today. )   History of Present Illness:  Christopher Nguyen is a 35 y.o. very pleasant male patient who presents with the following:  Pt seen today for a CPE Last visit with myself in January of this year when he was having abd pain and diarrhea on occasion  Patient with history of hypertension, overweight, prior smoker, using testosterone therapy  He ended up seeing GI and was dx with Crohn's disease - now being treated with mesalamine  He is feeling much better overall  No family history  Lisinopril for HTN ? Still taking testosterone   Flu vaccine-give today  Covid booster - recommended bivalent  Can update labs today   He is married to [employer]- his kids are 48 and 30 yo.  They are doing well He is trying to exercise He does not smoke, limited alcohol     Patient Active Problem List   Diagnosis Date Noted   Crohn's disease ([place]) 11/09/2020   Overweight 09/18/2015   Essential hypertension, benign 09/18/2015    Past Medical History:  Diagnosis Date   Azoospermia due to obstruction    Hypertension    Low testosterone     Past Surgical History:  Procedure Laterality Date   CLUB FOOT RELEASE  infant   TESTICLE BIOPSY Bilateral 01/28/2015   Procedure: BIOPSY TESTICULAR;  Surgeon: Carolan Clines, MD;  Location: [facility];  Service: Urology;  Laterality: Bilateral;    Social History   Tobacco Use   Smoking status: Never   Smokeless tobacco: Never  Vaping Use   Vaping Use: Never used  Substance Use Topics   Alcohol use: Yes    Comment: weekly   Drug use: No    Family History  Problem Relation Age of Onset    Bladder Cancer Mother    Colon cancer Father    Hypertension Father    Hypertension Brother    Melanoma Brother    Alcoholism Maternal Grandmother    Alcohol abuse Maternal Grandfather    Alcoholism Maternal Grandfather    Esophageal cancer Neg Hx    Rectal cancer Neg Hx    Stomach cancer Neg Hx     Allergies  Allergen Reactions   Cephalosporins Other (See Comments)    Unknown childhood reaction    Medication list has been reviewed and updated.  Current Outpatient Medications on File Prior to Visit  Medication Sig Dispense Refill   lisinopril (ZESTRIL) 10 MG tablet TAKE 1 TABLET(10 MG) BY MOUTH DAILY 90 tablet 1   Mesalamine (ASACOL HD) 800 MG TBEC Take 1 tablet (800 mg total) by mouth in the morning, at noon, and at bedtime. 270 tablet 5   Multiple Vitamin (MULTIVITAMIN) tablet Take 1 tablet by mouth daily.     testosterone cypionate (DEPOTESTOSTERONE CYPIONATE) 200 MG/ML injection Inject 100 mg into the muscle every 7 (seven) days.     No current facility-administered medications on file prior to visit.    Review of Systems:  As per HPI- otherwise negative.   Physical Examination: Vitals:   11/25/20 1324  BP: 120/80  Pulse: 89  Resp: 18  Temp: 98.2 F (36.8 C)  SpO2: 99%   Vitals:   11/25/20 1324  Weight: 169 lb 9.6 oz (76.9 kg)  Height: 5\' 3"  (1.6 m)   Body mass index is 30.04 kg/m. Ideal Body Weight: Weight in (lb) to have BMI = 25: 140.8  GEN: no acute distress. Overweight, looks well  HEENT: Atraumatic, Normocephalic.  Ears and Nose: No external deformity. CV: RRR, No M/G/R. No JVD. No thrill. No extra heart sounds. PULM: CTA B, no wheezes, crackles, rhonchi. No retractions. No resp. distress. No accessory muscle use. ABD: S, NT, ND, +BS. No rebound. No HSM. EXTR: No c/c/e PSYCH: Normally interactive. Conversant.    Assessment and Plan: Physical exam  Essential hypertension, benign - Plan: CBC, Comprehensive metabolic panel, lisinopril  (ZESTRIL) 10 MG tablet  Screening for diabetes mellitus - Plan: Hemoglobin A1c  Screening for hyperlipidemia - Plan: Lipid panel   CPE today  BP under good control Crohn's disease per GI- getting better Encourage exercise and healthy diet  Will plan further follow- up pending labs.  Signed Lamar Blinks, MD  Addendum 10/11, received his labs as below-message to patient  Results for orders placed or performed in visit on 11/25/20  CBC  Result Value Ref Range   WBC 11.1 (H) 4.0 - 10.5 K/uL   RBC 5.36 4.22 - 5.81 Mil/uL   Platelets 344.0 150.0 - 400.0 K/uL   Hemoglobin 15.6 13.0 - 17.0 g/dL   HCT 45.5 39.0 - 52.0 %   MCV 84.9 78.0 - 100.0 fl   MCHC 34.2 30.0 - 36.0 g/dL   RDW 12.9 11.5 - 15.5 %  Comprehensive metabolic panel  Result Value Ref Range   Sodium 138 135 - 145 mEq/L   Potassium 4.9 3.5 - 5.1 mEq/L   Chloride 99 96 - 112 mEq/L   CO2 31 19 - 32 mEq/L   Glucose, Bld 86 70 - 99 mg/dL   BUN 14 6 - 23 mg/dL   Creatinine, Ser 0.96 0.40 - 1.50 mg/dL   Total Bilirubin 0.5 0.2 - 1.2 mg/dL   Alkaline Phosphatase 58 39 - 117 U/L   AST 16 0 - 37 U/L   ALT 14 0 - 53 U/L   Total Protein 7.8 6.0 - 8.3 g/dL   Albumin 4.6 3.5 - 5.2 g/dL   GFR 102.27 >60.00 mL/min   Calcium 9.8 8.4 - 10.5 mg/dL  Hemoglobin A1c  Result Value Ref Range   Hgb A1c MFr Bld 5.4 4.6 - 6.5 %  Lipid panel  Result Value Ref Range   Cholesterol 209 (H) 0 - 200 mg/dL   Triglycerides (H) 0.0 - 149.0 mg/dL    531.0 Triglyceride is over 400; calculations on Lipids are invalid.   HDL 40.50 >39.00 mg/dL   Total CHOL/HDL Ratio 5   LDL cholesterol, direct  Result Value Ref Range   Direct LDL 120.0 mg/dL

## 2020-11-23 NOTE — Patient Instructions (Addendum)
Good to see you today- I will be in touch with your labs asap  Work on regular exercise as your schedule allows

## 2020-11-25 ENCOUNTER — Ambulatory Visit (INDEPENDENT_AMBULATORY_CARE_PROVIDER_SITE_OTHER): Payer: BC Managed Care – PPO | Admitting: Family Medicine

## 2020-11-25 ENCOUNTER — Other Ambulatory Visit: Payer: Self-pay

## 2020-11-25 ENCOUNTER — Encounter: Payer: Self-pay | Admitting: Family Medicine

## 2020-11-25 VITALS — BP 120/80 | HR 89 | Temp 98.2°F | Resp 18 | Ht 63.0 in | Wt 169.6 lb

## 2020-11-25 DIAGNOSIS — Z13220 Encounter for screening for lipoid disorders: Secondary | ICD-10-CM

## 2020-11-25 DIAGNOSIS — Z131 Encounter for screening for diabetes mellitus: Secondary | ICD-10-CM

## 2020-11-25 DIAGNOSIS — Z23 Encounter for immunization: Secondary | ICD-10-CM | POA: Diagnosis not present

## 2020-11-25 DIAGNOSIS — I10 Essential (primary) hypertension: Secondary | ICD-10-CM | POA: Diagnosis not present

## 2020-11-25 DIAGNOSIS — Z0000 Encounter for general adult medical examination without abnormal findings: Secondary | ICD-10-CM

## 2020-11-25 MED ORDER — LISINOPRIL 10 MG PO TABS: ORAL_TABLET | ORAL | 3 refills | Status: DC

## 2020-11-26 ENCOUNTER — Encounter: Payer: Self-pay | Admitting: Family Medicine

## 2020-11-26 LAB — CBC
HCT: 45.5 % (ref 39.0–52.0)
Hemoglobin: 15.6 g/dL (ref 13.0–17.0)
MCHC: 34.2 g/dL (ref 30.0–36.0)
MCV: 84.9 fl (ref 78.0–100.0)
Platelets: 344 10*3/uL (ref 150.0–400.0)
RBC: 5.36 Mil/uL (ref 4.22–5.81)
RDW: 12.9 % (ref 11.5–15.5)
WBC: 11.1 10*3/uL — ABNORMAL HIGH (ref 4.0–10.5)

## 2020-11-26 LAB — LIPID PANEL
Cholesterol: 209 mg/dL — ABNORMAL HIGH (ref 0–200)
HDL: 40.5 mg/dL (ref >39.00)
Total CHOL/HDL Ratio: 5
Triglycerides: 531 mg/dL — ABNORMAL HIGH (ref 0.0–149.0)

## 2020-11-26 LAB — COMPREHENSIVE METABOLIC PANEL
ALT: 14 U/L (ref 0–53)
AST: 16 U/L (ref 0–37)
Albumin: 4.6 g/dL (ref 3.5–5.2)
Alkaline Phosphatase: 58 U/L (ref 39–117)
BUN: 14 mg/dL (ref 6–23)
CO2: 31 mEq/L (ref 19–32)
Calcium: 9.8 mg/dL (ref 8.4–10.5)
Chloride: 99 mEq/L (ref 96–112)
Creatinine, Ser: 0.96 mg/dL (ref 0.40–1.50)
GFR: 102.27 mL/min (ref >60.00)
Glucose, Bld: 86 mg/dL (ref 70–99)
Potassium: 4.9 mEq/L (ref 3.5–5.1)
Sodium: 138 mEq/L (ref 135–145)
Total Bilirubin: 0.5 mg/dL (ref 0.2–1.2)
Total Protein: 7.8 g/dL (ref 6.0–8.3)

## 2020-11-26 LAB — HEMOGLOBIN A1C: Hgb A1c MFr Bld: 5.4 % (ref 4.6–6.5)

## 2020-11-26 LAB — LDL CHOLESTEROL, DIRECT: Direct LDL: 120 mg/dL

## 2021-01-23 DIAGNOSIS — E291 Testicular hypofunction: Secondary | ICD-10-CM | POA: Diagnosis not present

## 2021-01-31 DIAGNOSIS — E291 Testicular hypofunction: Secondary | ICD-10-CM | POA: Diagnosis not present

## 2021-02-14 ENCOUNTER — Emergency Department (HOSPITAL_BASED_OUTPATIENT_CLINIC_OR_DEPARTMENT_OTHER)
Admission: EM | Admit: 2021-02-14 | Discharge: 2021-02-14 | Disposition: A | Discharge status: Home / Self Care | Payer: BC Managed Care – PPO | Attending: Emergency Medicine | Admitting: Emergency Medicine

## 2021-02-14 ENCOUNTER — Other Ambulatory Visit: Payer: Self-pay

## 2021-02-14 ENCOUNTER — Encounter (HOSPITAL_BASED_OUTPATIENT_CLINIC_OR_DEPARTMENT_OTHER): Payer: Self-pay | Admitting: Emergency Medicine

## 2021-02-14 DIAGNOSIS — R0609 Other forms of dyspnea: Secondary | ICD-10-CM | POA: Insufficient documentation

## 2021-02-14 DIAGNOSIS — Z79899 Other long term (current) drug therapy: Secondary | ICD-10-CM | POA: Diagnosis not present

## 2021-02-14 DIAGNOSIS — I10 Essential (primary) hypertension: Secondary | ICD-10-CM | POA: Diagnosis not present

## 2021-02-14 DIAGNOSIS — R0602 Shortness of breath: Secondary | ICD-10-CM | POA: Insufficient documentation

## 2021-02-14 DIAGNOSIS — R030 Elevated blood-pressure reading, without diagnosis of hypertension: Secondary | ICD-10-CM | POA: Diagnosis not present

## 2021-02-14 DIAGNOSIS — R0789 Other chest pain: Secondary | ICD-10-CM | POA: Diagnosis not present

## 2021-02-14 DIAGNOSIS — R42 Dizziness and giddiness: Secondary | ICD-10-CM | POA: Insufficient documentation

## 2021-02-14 HISTORY — DX: Crohn's disease, unspecified, without complications: K50.90

## 2021-02-14 LAB — COMPREHENSIVE METABOLIC PANEL WITH GFR
ALT: 15 U/L (ref 0–44)
AST: 21 U/L (ref 15–41)
Albumin: 5.1 g/dL — ABNORMAL HIGH (ref 3.5–5.0)
Alkaline Phosphatase: 63 U/L (ref 38–126)
Anion gap: 11 (ref 5–15)
BUN: 13 mg/dL (ref 6–20)
CO2: 26 mmol/L (ref 22–32)
Calcium: 9.8 mg/dL (ref 8.9–10.3)
Chloride: 99 mmol/L (ref 98–111)
Creatinine, Ser: 0.99 mg/dL (ref 0.61–1.24)
GFR, Estimated: >60 mL/min
Glucose, Bld: 100 mg/dL — ABNORMAL HIGH (ref 70–99)
Potassium: 4.1 mmol/L (ref 3.5–5.1)
Sodium: 136 mmol/L (ref 135–145)
Total Bilirubin: 0.9 mg/dL (ref 0.3–1.2)
Total Protein: 8.9 g/dL — ABNORMAL HIGH (ref 6.5–8.1)

## 2021-02-14 LAB — CBC WITH DIFFERENTIAL/PLATELET
Abs Immature Granulocytes: 0.04 10*3/uL (ref 0.00–0.07)
Basophils Absolute: 0.1 10*3/uL (ref 0.0–0.1)
Basophils Relative: 0 %
Eosinophils Absolute: 0.1 10*3/uL (ref 0.0–0.5)
Eosinophils Relative: 1 %
HCT: 48.8 % (ref 39.0–52.0)
Hemoglobin: 17 g/dL (ref 13.0–17.0)
Immature Granulocytes: 0 %
Lymphocytes Relative: 22 %
Lymphs Abs: 2.6 10*3/uL (ref 0.7–4.0)
MCH: 29.2 pg (ref 26.0–34.0)
MCHC: 34.8 g/dL (ref 30.0–36.0)
MCV: 83.7 fL (ref 80.0–100.0)
Monocytes Absolute: 0.5 10*3/uL (ref 0.1–1.0)
Monocytes Relative: 4 %
Neutro Abs: 8.5 10*3/uL — ABNORMAL HIGH (ref 1.7–7.7)
Neutrophils Relative %: 73 %
Platelets: 354 10*3/uL (ref 150–400)
RBC: 5.83 MIL/uL — ABNORMAL HIGH (ref 4.22–5.81)
RDW: 12.1 % (ref 11.5–15.5)
WBC: 11.8 10*3/uL — ABNORMAL HIGH (ref 4.0–10.5)
nRBC: 0 % (ref 0.0–0.2)

## 2021-02-14 LAB — TROPONIN I (HIGH SENSITIVITY)
Troponin I (High Sensitivity): 3 ng/L (ref <18)
Troponin I (High Sensitivity): 3 ng/L

## 2021-02-14 LAB — D-DIMER, QUANTITATIVE: D-Dimer, Quant: <0.27 ug/mL-FEU (ref 0.00–0.50)

## 2021-02-14 MED ORDER — LISINOPRIL 10 MG PO TABS
10.0000 mg | ORAL_TABLET | Freq: Once | ORAL | Status: AC
  Administered 2021-02-14: 21:00:00 10 mg via ORAL
  Filled 2021-02-14: qty 1

## 2021-02-14 NOTE — ED Provider Notes (Signed)
[facility] EMERGENCY DEPARTMENT Provider Note   CSN: [id number] Arrival date & time: 02/14/21  1431     History Chief Complaint  Patient presents with   Hypertension    Christopher Nguyen is a 35 y.o. male.  This is a 35 y.o. male with significant medical history as below, including htn, crohn's disease who presents to the ED with complaint of elevated blood pressure reading, lightheadedness, upper chest, shoulder aching sensation.  Onset approximately 1 week ago, not improved after taking her blood pressure medications.  Not associate with fevers or chills, no nausea or vomiting.  No change in bowel or bladder function.  Pain not worsened with exertion.  He reports some mild dyspnea associated with his discomfort, worse with exertion.  No conversational dyspnea, no current difficulty with speaking.  Reports similar symptoms in the past associate with elevated blood pressure reading after THC usage.  Resolved spontaneously.  Patient denies recent drug or alcohol or stimulant use.  The history is provided by the patient and the spouse. No language interpreter was used.  Hypertension Associated symptoms include chest pain and shortness of breath. Pertinent negatives include no abdominal pain and no headaches.      Past Medical History:  Diagnosis Date   Azoospermia due to obstruction    Crohn disease ([place])    Hypertension    Low testosterone     Patient Active Problem List   Diagnosis Date Noted   Crohn's disease ([place]) 11/09/2020   Overweight 09/18/2015   Essential hypertension, benign 09/18/2015    Past Surgical History:  Procedure Laterality Date   CLUB FOOT RELEASE  infant   TESTICLE BIOPSY Bilateral 01/28/2015   Procedure: BIOPSY TESTICULAR;  Surgeon: Carolan Clines, MD;  Location: [facility];  Service: Urology;  Laterality: Bilateral;       Family History  Problem Relation Age of Onset   Bladder Cancer Mother    Colon cancer Father     Hypertension Father    Hypertension Brother    Melanoma Brother    Alcoholism Maternal Grandmother    Alcohol abuse Maternal Grandfather    Alcoholism Maternal Grandfather    Esophageal cancer Neg Hx    Rectal cancer Neg Hx    Stomach cancer Neg Hx     Social History   Tobacco Use   Smoking status: Never   Smokeless tobacco: Never  Vaping Use   Vaping Use: Never used  Substance Use Topics   Alcohol use: Yes    Comment: weekly   Drug use: No    Home Medications Prior to Admission medications   Medication Sig Start Date End Date Taking? Authorizing Provider  lisinopril (ZESTRIL) 10 MG tablet TAKE 1 TABLET(10 MG) BY MOUTH DAILY 11/25/20   Copland, Gay Filler, MD  Mesalamine (ASACOL HD) 800 MG TBEC Take 1 tablet (800 mg total) by mouth in the morning, at noon, and at bedtime. 05/08/20 10/30/21  Cirigliano, [name] V, DO  Multiple Vitamin (MULTIVITAMIN) tablet Take 1 tablet by mouth daily.    [provider]  testosterone cypionate (DEPOTESTOSTERONE CYPIONATE) 200 MG/ML injection Inject 100 mg into the muscle every 7 (seven) days. 08/06/19   [provider]    Allergies    Cephalosporins  Review of Systems   Review of Systems  Constitutional:  Negative for chills and fever.  HENT:  Negative for facial swelling and trouble swallowing.   Eyes:  Negative for photophobia and visual disturbance.  Respiratory:  Positive for  shortness of breath. Negative for cough.   Cardiovascular:  Positive for chest pain. Negative for palpitations.  Gastrointestinal:  Negative for abdominal pain, nausea and vomiting.  Endocrine: Negative for polydipsia and polyuria.  Genitourinary:  Negative for difficulty urinating and hematuria.  Musculoskeletal:  Positive for arthralgias. Negative for gait problem and joint swelling.  Skin:  Negative for pallor and rash.  Neurological:  Positive for light-headedness. Negative for syncope and headaches.  Psychiatric/Behavioral:  Negative for  agitation and confusion.    Physical Exam Updated Vital Signs BP (!) 135/96    Pulse 72    Temp 98 F (36.7 C) (Oral)    Resp 16    Ht 5\' 3"  (1.6 m)    Wt 74.8 kg    SpO2 99%    BMI 29.23 kg/m   Physical Exam Vitals and nursing note reviewed.  Constitutional:      General: He is not in acute distress.    Appearance: Normal appearance. He is well-developed. He is not ill-appearing, toxic-appearing or diaphoretic.  HENT:     Head: Normocephalic and atraumatic.     Right Ear: External ear normal.     Left Ear: External ear normal.     Mouth/Throat:     Mouth: Mucous membranes are moist.  Eyes:     General: No scleral icterus.    Extraocular Movements: Extraocular movements intact.     Pupils: Pupils are equal, round, and reactive to light.  Cardiovascular:     Rate and Rhythm: Normal rate and regular rhythm.     Pulses: Normal pulses.     Heart sounds: Normal heart sounds. No murmur heard.   No gallop.  Pulmonary:     Effort: Pulmonary effort is normal. No respiratory distress.     Breath sounds: Normal breath sounds. No stridor.  Abdominal:     General: Abdomen is flat. There is no distension.     Palpations: Abdomen is soft.     Tenderness: There is no abdominal tenderness.  Musculoskeletal:        General: Normal range of motion.     Cervical back: Normal range of motion.     Right lower leg: No edema.     Left lower leg: No edema.  Skin:    General: Skin is warm and dry.     Capillary Refill: Capillary refill takes less than 2 seconds.  Neurological:     Mental Status: He is alert and oriented to person, place, and time.     GCS: GCS eye subscore is 4. GCS verbal subscore is 5. GCS motor subscore is 6.     Cranial Nerves: Cranial nerves 2-12 are intact. No dysarthria or facial asymmetry.     Sensory: Sensation is intact.     Motor: Motor function is intact. No tremor.     Coordination: Coordination is intact. Finger-Nose-Finger Test normal.     Gait: Gait is intact.   Psychiatric:        Mood and Affect: Mood normal.        Behavior: Behavior normal.    ED Results / Procedures / Treatments   Labs (all labs ordered are listed, but only abnormal results are displayed) Labs Reviewed  COMPREHENSIVE METABOLIC PANEL - Abnormal; Notable for the following components:      Result Value   Glucose, Bld 100 (*)    Total Protein 8.9 (*)    Albumin 5.1 (*)    All other components within normal limits  CBC WITH DIFFERENTIAL/PLATELET - Abnormal; Notable for the following components:   WBC 11.8 (*)    RBC 5.83 (*)    Neutro Abs 8.5 (*)    All other components within normal limits  D-DIMER, QUANTITATIVE  TROPONIN I (HIGH SENSITIVITY)  TROPONIN I (HIGH SENSITIVITY)    EKG EKG Interpretation  Date/Time:  Friday February 14 2021 15:23:24 EST Ventricular Rate:  66 PR Interval:  132 QRS Duration: 104 QT Interval:  370 QTC Calculation: 387 R Axis:   61 Text Interpretation: Normal sinus rhythm Incomplete right bundle branch block Borderline ECG When compared with ECG of 31-Aug-2019 11:20, PREVIOUS ECG IS PRESENT similar to prior Confirmed by Wynona Dove ([id number]) on 02/14/2021 9:50:02 PM  Radiology No results found.  Procedures Procedures   Medications Ordered in ED Medications  lisinopril (ZESTRIL) tablet 10 mg (10 mg Oral Given 02/14/21 2058)    ED Course  I have reviewed the triage vital signs and the nursing notes.  Pertinent labs & imaging results that were available during my care of the patient were reviewed by me and considered in my medical decision making (see chart for details).    MDM Rules/Calculators/A&P                          CC: Chest pain, shoulder pain  This patient complains of above; this involves an extensive number of treatment options and is a complaint that carries with it a high risk of complications and morbidity. Vital signs were reviewed. Serious etiologies considered.  Neurologic exam is nonfocal.  No murmurs.   Ambulatory steady gait.  Nontoxic-appearing.  Breathing comfortably on room air.  Physical exam is reassuring  Record review:  Previous records obtained and reviewed   Additional history obtained from spouse   Work up as above, notable for:  Labs & imaging results that were available during my care of the patient were reviewed by me and considered in my medical decision making.   I ordered imaging studies which included chest x-ray and I independently visualized and interpreted imaging which showed no acute process  Risk Wells score, obtain D-dimer.  D-dimer negative.  PE is unlikely  Management: Given home blood pressure medication.  Reassessment:  Blood pressure improved following medication administration, symptoms improved.  Patient feels roughly at his baseline.  No dyspnea.    The patient's chest pain is not suggestive of pulmonary embolus, cardiac ischemia, aortic dissection, pericarditis, myocarditis, pulmonary embolism, pneumothorax, pneumonia, Zoster, or esophageal perforation, or other serious etiology.  Historically not abrupt in onset, tearing or ripping, pulses symmetric. EKG nonspecific for ischemia/infarction. No dysrhythmias, brugada, WPW, prolonged QT noted. Troponin negative x2. CXR reviewed. Labs without demonstration of acute pathology unless otherwise noted above. Low HEART Score: 0-3 points (0.9-1.7% risk of MACE). Given the extremely low risk of these diagnoses further testing and evaluation for these possibilities does not appear to be indicated at this time. Patient in no distress and overall condition improved here in the ED. Detailed discussions were had with the patient regarding current findings, and need for close f/u with PCP or on call doctor. The patient has been instructed to return immediately if the symptoms worsen in any way for re-evaluation. Patient verbalized understanding and is in agreement with current care plan. All questions answered prior to  discharge.     This chart was dictated using voice recognition software.  Despite best efforts to proofread,  errors can occur which can change  the documentation meaning.     Final Clinical Impression(s) / ED Diagnoses Final diagnoses:  Elevated blood pressure reading  Atypical chest pain    Rx / DC Orders ED Discharge Orders     None        Jeanell Sparrow, DO 02/14/21 2206

## 2021-02-14 NOTE — ED Triage Notes (Addendum)
BP elevated this week; taking meds as directed; also c/o LUE weakness, LT neck and shoulder pain

## 2021-02-14 NOTE — ED Notes (Signed)
Discharge instructions discussed with pt. Pt verbalized understanding. Pt stable and ambulatory.  °

## 2021-02-14 NOTE — ED Notes (Signed)
Patient given refreshments.

## 2021-02-14 NOTE — Discharge Instructions (Addendum)
Please follow-up with your primary care doctor regarding blood pressure medication management  Return to emergency department for any worsening or worrisome symptoms

## 2021-02-18 ENCOUNTER — Other Ambulatory Visit: Payer: Self-pay

## 2021-02-18 ENCOUNTER — Ambulatory Visit (INDEPENDENT_AMBULATORY_CARE_PROVIDER_SITE_OTHER): Payer: BC Managed Care – PPO | Admitting: Gastroenterology

## 2021-02-18 ENCOUNTER — Encounter: Payer: Self-pay | Admitting: Gastroenterology

## 2021-02-18 VITALS — BP 138/92 | HR 76 | Resp 98 | Ht 63.0 in | Wt 168.2 lb

## 2021-02-18 DIAGNOSIS — Z800 Family history of malignant neoplasm of digestive organs: Secondary | ICD-10-CM

## 2021-02-18 DIAGNOSIS — K50119 Crohn's disease of large intestine with unspecified complications: Secondary | ICD-10-CM

## 2021-02-18 DIAGNOSIS — K760 Fatty (change of) liver, not elsewhere classified: Secondary | ICD-10-CM | POA: Diagnosis not present

## 2021-02-18 DIAGNOSIS — K828 Other specified diseases of gallbladder: Secondary | ICD-10-CM

## 2021-02-18 DIAGNOSIS — K3189 Other diseases of stomach and duodenum: Secondary | ICD-10-CM

## 2021-02-18 MED ORDER — MESALAMINE 800 MG PO TBEC: 800.0000 mg | DELAYED_RELEASE_TABLET | Freq: Three times a day (TID) | ORAL | 11 refills | Status: DC

## 2021-02-18 NOTE — Progress Notes (Signed)
Chief Complaint:    Crohn's Disease  GI History: 63 male with history of HTN, initially seen in the GI clinic on 03/28/2020 for evaluation of RUQ pain, change in bowel habits, nonbloody diarrhea and subsequently diagnosed with Crohn's Disease as below.   1) RUQ pain, biliary dyskinesia: Started 07/2019. Typically post prandial RUQ pain and also post prandial BM. Sxs intermittent. No radiation, n/v/f/c.  Rare episodes that wake him from sleep. No improvement with trial of Prilosec.  -RUQ [place] (08/2019): Fatty liver, otherwise normal -08/2019: WBC 11.5, otherwise normal CBC, CMP, acute viral hep panel -02/2020: WBC 10.6, otherwise normal CBC, CMP, amylase, lipase -04/2020: Normal tTG -04/2020: HIDA scan: EF 8% consistent with biliary dyskinesia/chronic cholecystitis.  Referred to General Surgery, Christopher Nguyen declined referral   2) Crohn's Disease (Crohn's Colitis): Diagnosed by colonoscopy in 04/2020.  Index symptoms of 6-7 loose/watery, nonbloody stools per day, +urgency. +mucus-like stools, with associated RUQ pain.   -04/2020: Fecal calprotectin 915.  Normal ESR, CRP, celiac panel.  Negative GI PCR panel -Colonoscopy 04/2020 with mild patchy colitis from sigmoid to cecum with areas of normal mucosa.  Biopsies consistent with active, chronic colitis.  Relative rectal sparing, all suggestive of Crohn's colitis.  Normal TI.  Prescribed Lialda 4.8 g/day, but changed to Asacol HD due to Christopher Nguyen concerns re tablet size - 05/2020: GI follow-up.  Reported clinical improvement since starting Asacol HD. - 08/2020: Fecal calprotectin 38   3) Family history of colon cancer: Father with colon cancer, diagnosed in his 68s and died at age 105   4) Hepatic steatosis: Incidentally noted on RUQ [place]   Endoscopic History: -EGD (05/01/2020, Dr. Bryan Lemma): 6 mm SEL in the antrum (path: Gastritis without dysplasia/metaplasia), Otherwise normal stomach (biopsies negative for H. pylori) and duodenum (biopsies: Increased IELs  without villous flattening).  Repeat endoscopy in 1 year for surveillance of antral nodule -Colonoscopy (05/01/2020, Dr. Bryan Lemma): Patchy mild colitis from sigmoid to cecum, with areas of normal mucosa (path: Moderate to severe active, chronic colitis).  Rectal sparing, with transition to normal mucosa at 18 cm.  Normal TI.  HPI:     Christopher Nguyen is a 36 y.o. male presenting to the Gastroenterology Clinic for follow-up.  Last seen by me on 05/22/2020.  Was doing well at that time after starting Asacol HD.  Had plan for serial BMP checks, repeat calprotectin, and repeat colonoscopy in 6 months to evaluate for deep remission.  I also recommended repeat upper endoscopy at that time due to increased IELs with some concern for early UGI Crohn's which would necessitate escalation of therapy.  Can also monitor gastric subepithelial nodule at that time.  Fecal calprotectin normalized.  No new imaging since last appointment.  Since then, states he has felt well. Offers no new complaints today. Tolerating Asacol without issue. Will have occasional "mini flares" described as watery, non-bloody stools without mucus or urgency, lasting for a few days at a time, then resolve w/o intervention. No abdominal pain.    Review of systems:     No chest pain, no SOB, no fevers, no urinary sx   Past Medical History:  Diagnosis Date   Azoospermia due to obstruction    Crohn disease ([place])    Hypertension    Low testosterone     Christopher Nguyen's surgical history, family medical history, social history, medications and allergies were all reviewed in Epic    Current Outpatient Medications  Medication Sig Dispense Refill   lisinopril (ZESTRIL) 10 MG tablet TAKE 1 TABLET(10  MG) BY MOUTH DAILY 90 tablet 3   Mesalamine (ASACOL HD) 800 MG TBEC Take 1 tablet (800 mg total) by mouth in the morning, at noon, and at bedtime. 270 tablet 5   Multiple Vitamin (MULTIVITAMIN) tablet Take 1 tablet by mouth daily.     testosterone cypionate  (DEPOTESTOSTERONE CYPIONATE) 200 MG/ML injection Inject 100 mg into the muscle every 7 (seven) days.     No current facility-administered medications for this visit.    Physical Exam:     BP (!) 138/92 (BP Location: Left Arm, Christopher Nguyen Position: Sitting, Cuff Size: Normal)    Pulse 76    Resp (!) 98    Ht $R'5\' 3"'rw$  (1.6 m)    Wt 168 lb 4 oz (76.3 kg)    BMI 29.80 kg/m   GENERAL:  Pleasant male in NAD PSYCH: : Cooperative, normal affect EENT:  conjunctiva pink, mucous membranes moist, neck supple without masses CARDIAC:  RRR, no murmur heard, no peripheral edema PULM: Normal respiratory effort, lungs CTA bilaterally, no wheezing ABDOMEN:  Nondistended, soft, nontender. No obvious masses, no hepatomegaly,  normal bowel sounds SKIN:  turgor, no lesions seen Musculoskeletal:  Normal muscle tone, normal strength NEURO: Alert and oriented x 3, no focal neurologic deficits   IMPRESSION and PLAN:    1) Crohn's Colitis 36 year old male with Crohn's Colitis diagnosed in 04/2020.  Good clinical response to 5-ASA therapy.  Fecal calprotectin normalized and good clinical improvement.  - Continue Asacol.  Refill prescribed today - BMP checks have been normal.  Repeat BMP in 12 months - Repeat colonoscopy to evaluate for deep remission - Repeat upper endoscopy due to increased duodenal IELs on previous study with plan to rule out upper tract Crohn's  2) Gastric subepithelial nodule -Incidentally noted at time of upper endoscopy.  Path with reactive inflammatory changes, but no metaplasia or dysplasia -Per current guidelines, given size  <1 cm, plan for repeat upper endoscopy as above to evaluate for size/appearance and stability.  If any increase in size or change in appearance, plan for EUS  3) Biliary dyskinesia - HIDA scan in 2022 with decreased EF.  Has otherwise been symptom-free though for the last 6+ months.  Christopher Nguyen previously declined referral to [facility] and again would like to hold off on  surgical consultation at this juncture  4) Family history of colon cancer - Continue short interval screening/surveillance due to personal history of Crohn's Colitis along with family history  5) Leukocytosis - Has had a very mild but persistent leukocytosis with most recent WBC 11.8 last month. Has been mildly elevated since 2017 (11.2) - Repeat CBC with differential now.  If still elevated, Hematology referral  6) Hepatic steatosis -Incidentally noted at the time of RUQ [place].  Liver enzymes otherwise normal. -Conservative management with plan for modest weight loss of 10% body weight done slowly over weeks to months  The indications, risks, and benefits of EGD and colonoscopy were explained to the Christopher Nguyen in detail. Risks include but are not limited to bleeding, perforation, adverse reaction to medications, and cardiopulmonary compromise. Sequelae include but are not limited to the possibility of surgery, hospitalization, and mortality. The Christopher Nguyen verbalized understanding and wished to proceed. All questions answered, referred to scheduler and bowel prep ordered. Further recommendations pending results of the exam.           Lavena Bullion ,DO, FACG 02/18/2021, 2:59 PM

## 2021-02-18 NOTE — Patient Instructions (Addendum)
If you are age 36 or older, your body mass index should be between 23-30. Your There is no height or weight on file to calculate BMI. If this is out of the aforementioned range listed, please consider follow up with your Primary Care Provider.  If you are age 27 or younger, your body mass index should be between 19-25. Your There is no height or weight on file to calculate BMI. If this is out of the aformentioned range listed, please consider follow up with your Primary Care Provider.     __________________________________________________________  The [facility] providers would like to encourage you to use [facility] to communicate with providers for non-urgent requests or questions.  Due to long hold times on the telephone, sending your provider a message by [facility] may be a faster and more efficient way to get a response.  Please allow 48 business hours for a response.  Please remember that this is for non-urgent requests.    Due to recent changes in healthcare laws, you may see the results of your imaging and laboratory studies on MyChart before your provider has had a chance to review them.  We understand that in some cases there may be results that are confusing or concerning to you. Not all laboratory results come back in the same time frame and the provider may be waiting for multiple results in order to interpret others.  Please give [place] 48 hours in order for your provider to thoroughly review all the results before contacting the office for clarification of your results.   We have sent the following medications to your pharmacy for you to pick up at your convenience:  Asacol  We are giving you a sample of Clenpiq to be used for your colonospcopy  Thank you for choosing me and [facility].  [name] Cirigliano, D.O.

## 2021-02-21 ENCOUNTER — Encounter: Payer: Self-pay | Admitting: Family Medicine

## 2021-02-23 NOTE — Progress Notes (Deleted)
[place] at [facility] [address], [place], [place] [address] [phone]  Date:  02/26/2021   Name:  Christopher Nguyen   DOB:  [birth year]   MRN:  [id number]  PCP:  Christopher Mclean, MD    Chief Complaint: No chief complaint on file.   History of Present Illness:  Christopher Nguyen is a 36 y.o. very pleasant male patient who presents with the following:  Patient following up today from recent ER visit.  History of Crohn's disease and hypertension Most recent visit with myself was in [place] that time his blood pressure looks good on 10 mg of lisinopril However, he presented to the ER on December 30 with chest discomfort and elevated blood pressure Blood pressure was moderately elevated at that time, he contacted me for follow-up and I suggested increasing lisinopril to 20 mg as needed Patient Active Problem List   Diagnosis Date Noted   Crohn's disease ([place]) 11/09/2020   Overweight 09/18/2015   Essential hypertension, benign 09/18/2015    Past Medical History:  Diagnosis Date   Azoospermia due to obstruction    Crohn disease ([place])    Hypertension    Low testosterone     Past Surgical History:  Procedure Laterality Date   CLUB FOOT RELEASE  infant   TESTICLE BIOPSY Bilateral 01/28/2015   Procedure: BIOPSY TESTICULAR;  Surgeon: Carolan Clines, MD;  Location: [place];  Service: Urology;  Laterality: Bilateral;    Social History   Tobacco Use   Smoking status: Never   Smokeless tobacco: Never  Vaping Use   Vaping Use: Never used  Substance Use Topics   Alcohol use: Yes    Comment: weekly   Drug use: No    Family History  Problem Relation Age of Onset   Bladder Cancer Mother    Colon cancer Father    Hypertension Father    Hypertension Brother    Melanoma Brother    Alcoholism Maternal Grandmother    Alcohol abuse Maternal Grandfather    Alcoholism Maternal Grandfather    Esophageal  cancer Neg Hx    Rectal cancer Neg Hx    Stomach cancer Neg Hx     Allergies  Allergen Reactions   Cephalosporins Other (See Comments)    Unknown childhood reaction    Medication list has been reviewed and updated.  Current Outpatient Medications on File Prior to Visit  Medication Sig Dispense Refill   lisinopril (ZESTRIL) 10 MG tablet TAKE 1 TABLET(10 MG) BY MOUTH DAILY 90 tablet 3   Mesalamine (ASACOL HD) 800 MG TBEC Take 1 tablet (800 mg total) by mouth in the morning, at noon, and at bedtime. 90 tablet 11   Multiple Vitamin (MULTIVITAMIN) tablet Take 1 tablet by mouth daily.     testosterone cypionate (DEPOTESTOSTERONE CYPIONATE) 200 MG/ML injection Inject 100 mg into the muscle every 7 (seven) days.     No current facility-administered medications on file prior to visit.    Review of Systems:  As per HPI- otherwise negative.   Physical Examination: There were no vitals filed for this visit. There were no vitals filed for this visit. There is no height or weight on file to calculate BMI. Ideal Body Weight:    GEN: no acute distress. HEENT: Atraumatic, Normocephalic.  Ears and Nose: No external deformity. CV: RRR, No M/G/R. No JVD. No thrill. No extra heart sounds. PULM: CTA B, no wheezes, crackles, rhonchi.  No retractions. No resp. distress. No accessory muscle use. ABD: S, NT, ND, +BS. No rebound. No HSM. EXTR: No c/c/e PSYCH: Normally interactive. Conversant.    Assessment and Plan: ***  Signed Lamar Blinks, MD

## 2021-02-26 ENCOUNTER — Ambulatory Visit: Payer: BC Managed Care – PPO | Admitting: Family Medicine

## 2021-02-26 ENCOUNTER — Encounter: Payer: Self-pay | Admitting: Family Medicine

## 2021-02-26 VITALS — BP 132/88 | HR 90 | Temp 97.9°F | Resp 18 | Ht 63.0 in | Wt 167.8 lb

## 2021-02-26 DIAGNOSIS — I10 Essential (primary) hypertension: Secondary | ICD-10-CM | POA: Diagnosis not present

## 2021-02-26 DIAGNOSIS — F410 Panic disorder [episodic paroxysmal anxiety] without agoraphobia: Secondary | ICD-10-CM

## 2021-02-26 MED ORDER — ALPRAZOLAM 0.25 MG PO TABS: 0.2500 mg | ORAL_TABLET | Freq: Two times a day (BID) | ORAL | 0 refills | Status: DC | PRN

## 2021-02-26 MED ORDER — AMLODIPINE BESYLATE 5 MG PO TABS: 5.0000 mg | ORAL_TABLET | Freq: Every day | ORAL | 3 refills | Status: DC

## 2021-02-26 NOTE — Patient Instructions (Signed)
Good to see you again today- I will be in touch with your thyroid level Try switching out lisinopril for amlodipine 5 mg- let me know how you do. Ok to add 5- 10 mg of lisinopril if you are running too high Ok to use alprazolam as needed for panic- this can be sedating and habit forming so use with caution  Keep me posted about your progress

## 2021-02-26 NOTE — Progress Notes (Addendum)
[place] at [facility] [address] [place], [place] [address] [phone]  Date:  02/26/2021   Name:  Christopher Nguyen   DOB:  1985-11-14   MRN:  [id number]  PCP:  [name], MD    Chief Complaint: ER follow up (02/14/21: high blood pressure and Symptoms or Anxiety attacks started 3 weeks prior to ER visit. )   History of Present Illness:  Christopher Nguyen is a 36 y.o. very pleasant male patient who presents with the following:  Patient following up today from recent ER visit.  History of Crohn's disease and hypertension Most recent visit with myself was in [place] that time his blood pressure looked good on 10 mg of lisinopril However, he presented to the ER on December 30 with chest discomfort and elevated blood pressure.  He was evaluated and released to home  Blood pressure was moderately elevated at that time, he contacted me for follow-up and I suggested increasing lisinopril to 20 mg as needed  Since that time pt notes he is not feeling that great, he is concerned about his blood pressure and thinks he may be suffering from anxiety He has felt like he might be coming down with something for a month or so- tired, a bit achy His BP has been running 140- 145/ 95- 105 He is no longer having any CP or shortness of breath-in fact, he ran 2 miles this afternoon and felt great He on the other hand notes that he feels like he might be having occasional panic attacks- he will have episodes of feeling very anxious, and noted tachycardia, feeling lightheaded at times  He notes his mother had CHF- she was a heavy smoker   He did have a panic attack once in the past after taking a synthetic MJ; otherwise he has not tended towards panic attacks  His testocerone is thorough urology   He did a stress test 1/21: The patient walked for 9 minutes and 54 seconds on a standard Bruce protocol treadmill test. He achieved a peak heart rate  of 181 which is 96% predicted maximal heart rate. At peak exercise there were no ST or T wave changes to suggest ischemia. There is no QRS widening at peak exercise. There was a hypertensive blood pressure response to exercise. This is interpreted as a negative exercise test. There is no evidence of ischemia. The patient has good exercise capacity. There was a hypertensive response to exercise.  BP Readings from Last 3 Encounters:  02/26/21 132/88  02/18/21 (!) 138/92  02/14/21 (!) 141/104   He has been on lisinopril for about 5 years - might have some mild cough but otherwise has not generally had side effects However he now states he seems to "feel bad" when he takes his BP medication and wonders about taking something else  Patient Active Problem List   Diagnosis Date Noted   Crohn's disease ([place]) 11/09/2020   Overweight 09/18/2015   Essential hypertension, benign 09/18/2015    Past Medical History:  Diagnosis Date   Azoospermia due to obstruction    Crohn disease ([place])    Hypertension    Low testosterone     Past Surgical History:  Procedure Laterality Date   CLUB FOOT RELEASE  infant   TESTICLE BIOPSY Bilateral 01/28/2015   Procedure: BIOPSY TESTICULAR;  Surgeon: Carolan Clines, MD;  Location: [place];  Service: Urology;  Laterality: Bilateral;  Social History   Tobacco Use   Smoking status: Never   Smokeless tobacco: Never  Vaping Use   Vaping Use: Never used  Substance Use Topics   Alcohol use: Yes    Comment: weekly   Drug use: No    Family History  Problem Relation Age of Onset   Bladder Cancer Mother    Colon cancer Father    Hypertension Father    Hypertension Brother    Melanoma Brother    Alcoholism Maternal Grandmother    Alcohol abuse Maternal Grandfather    Alcoholism Maternal Grandfather    Esophageal cancer Neg Hx    Rectal cancer Neg Hx    Stomach cancer Neg Hx     Allergies  Allergen Reactions   Bentyl  [Dicyclomine] Swelling   Cephalosporins Other (See Comments)    Unknown childhood reaction    Medication list has been reviewed and updated.  Current Outpatient Medications on File Prior to Visit  Medication Sig Dispense Refill   Mesalamine (ASACOL HD) 800 MG TBEC Take 1 tablet (800 mg total) by mouth in the morning, at noon, and at bedtime. 90 tablet 11   Multiple Vitamin (MULTIVITAMIN) tablet Take 1 tablet by mouth daily.     testosterone cypionate (DEPOTESTOSTERONE CYPIONATE) 200 MG/ML injection Inject 100 mg into the muscle every 7 (seven) days.     No current facility-administered medications on file prior to visit.    Review of Systems:  As per HPI- otherwise negative.   Physical Examination: Vitals:   02/26/21 1542 02/26/21 1607  BP: 132/88   Pulse: (!) 114 90  Resp: 18   Temp: 97.9 F (36.6 C)   SpO2: 98%    Vitals:   02/26/21 1542  Weight: 167 lb 12.8 oz (76.1 kg)  Height: 5\' 3"  (1.6 m)   Body mass index is 29.72 kg/m. Ideal Body Weight: Weight in (lb) to have BMI = 25: 140.8  GEN: no acute distress.  Overweight, looks well HEENT: Atraumatic, Normocephalic.  Ears and Nose: No external deformity. CV: RRR, No M/G/R. No JVD. No thrill. No extra heart sounds. PULM: CTA B, no wheezes, crackles, rhonchi. No retractions. No resp. distress. No accessory muscle use. ABD: S, NT, ND, +BS. No rebound. No HSM. EXTR: No c/c/e PSYCH: Normally interactive. Conversant.   Pulse Readings from Last 3 Encounters:  02/26/21 90  02/18/21 76  02/14/21 66    Assessment and Plan: Essential hypertension, benign - Plan: amLODipine (NORVASC) 5 MG tablet, TSH  Panic attack - Plan: ALPRAZolam (XANAX) 0.25 MG tablet, TSH  Patient seen today with concern of high blood pressure and possible panic attacks.  He has been treated with lisinopril for several years for elevated blood pressure and did well.  However recently his pressures have seemed to go up.  We tried increasing  lisinopril from 10 to 20 mg but he has noted potential malaise and fatigue from this dose increase  We will have him stop lisinopril and try amlodipine 5, I have asked that he me posted on his blood pressures so we can adjust medications  Prescribed alprazolam 0.25 to use as needed for panic attack.  Counseled caution with this medication  Also check TSH to rule out thyroid disorder as cause of these symptoms  Signed Lamar Blinks, MD  Addendum 1/12, received TSH as below.  Message to patient   Results for orders placed or performed in visit on 02/26/21  TSH  Result Value Ref Range   TSH  0.94 0.35 - 5.50 uIU/mL

## 2021-02-27 ENCOUNTER — Encounter: Payer: Self-pay | Admitting: Family Medicine

## 2021-02-27 LAB — TSH: TSH: 0.94 u[IU]/mL (ref 0.35–5.50)

## 2021-03-17 ENCOUNTER — Encounter: Payer: Self-pay | Admitting: Gastroenterology

## 2021-03-23 ENCOUNTER — Encounter: Payer: Self-pay | Admitting: Certified Registered Nurse Anesthetist

## 2021-03-24 ENCOUNTER — Encounter: Payer: Self-pay | Admitting: Gastroenterology

## 2021-03-24 ENCOUNTER — Ambulatory Visit (AMBULATORY_SURGERY_CENTER): Payer: BC Managed Care – PPO | Admitting: Gastroenterology

## 2021-03-24 ENCOUNTER — Other Ambulatory Visit: Payer: Self-pay

## 2021-03-24 VITALS — BP 120/73 | HR 70 | Temp 99.3°F | Resp 17 | Ht 63.0 in | Wt 167.0 lb

## 2021-03-24 DIAGNOSIS — K31A0 Gastric intestinal metaplasia, unspecified: Secondary | ICD-10-CM | POA: Diagnosis not present

## 2021-03-24 DIAGNOSIS — K2970 Gastritis, unspecified, without bleeding: Secondary | ICD-10-CM | POA: Diagnosis not present

## 2021-03-24 DIAGNOSIS — K2950 Unspecified chronic gastritis without bleeding: Secondary | ICD-10-CM

## 2021-03-24 DIAGNOSIS — K3189 Other diseases of stomach and duodenum: Secondary | ICD-10-CM

## 2021-03-24 DIAGNOSIS — K50119 Crohn's disease of large intestine with unspecified complications: Secondary | ICD-10-CM

## 2021-03-24 DIAGNOSIS — K529 Noninfective gastroenteritis and colitis, unspecified: Secondary | ICD-10-CM

## 2021-03-24 MED ORDER — SODIUM CHLORIDE 0.9 % IV SOLN: 500.0000 mL | Freq: Once | INTRAVENOUS | Status: DC

## 2021-03-24 NOTE — Op Note (Signed)
[place] Patient Name: Christopher Nguyen Procedure Date: 03/24/2021 1:49 PM MRN: [id number] Endoscopist: Gerrit Heck , MD Age: 36 Referring MD:  Date of Birth: 1985/08/31 Gender: Male Account #: [id number] Procedure:                Upper GI endoscopy Indications:              Crohn's disease, Gastric nodule follow-up,                            Follow-up of increased small bowel intraepithelial                            lymphocytes                           EGD on 05/01/2020 notable for 6 mm SEL in the antrum                            antrum (path: Gastritis without                            dysplasia/metaplasia), Otherwise normal stomach                            (biopsies negative for H. pylori) and duodenum                            (biopsies: Increased IELs without villous                            flattening). He returns today for repeat endoscopy                            for 1 year for surveillance of antral nodule and to                            evaluate for uppe tract Crohns Disease. Medicines:                Monitored Anesthesia Care Procedure:                Pre-Anesthesia Assessment:                           - Prior to the procedure, a History and Physical                            was performed, and patient medications and                            allergies were reviewed. The patient's tolerance of                            previous anesthesia was also reviewed. The risks  and benefits of the procedure and the sedation                            options and risks were discussed with the patient.                            All questions were answered, and informed consent                            was obtained. Prior Anticoagulants: The patient has                            taken no previous anticoagulant or antiplatelet                            agents. ASA Grade Assessment: II - A patient with                             mild systemic disease. After reviewing the risks                            and benefits, the patient was deemed in                            satisfactory condition to undergo the procedure.                           After obtaining informed consent, the endoscope was                            passed under direct vision. Throughout the                            procedure, the patient's blood pressure, pulse, and                            oxygen saturations were monitored continuously. The                            Endoscope was introduced through the mouth, and                            advanced to the third part of duodenum. The upper                            GI endoscopy was accomplished without difficulty.                            The patient tolerated the procedure well. Scope In: Scope Out: Findings:                 The examined esophagus was normal.  A single 6 mm submucosal nodule with no bleeding                            and no stigmata of recent bleeding was found in the                            gastric antrum. The overlying mucosa was normal                            appearing. This appeared unchanged from the                            previosu exam. Biopsies were taken with a cold                            forceps for histology. Estimated blood loss was                            minimal.                           Normal mucosa was found in the entire examined                            stomach.                           The examined duodenum was normal. Biopsies were                            taken with a cold forceps for histology. Estimated                            blood loss was minimal. Complications:            No immediate complications. Estimated Blood Loss:     Estimated blood loss was minimal. Impression:               - Normal esophagus.                           - A single submucosal nodule found in the stomach.                             Biopsied.                           - Normal mucosa was found in the entire stomach.                           - Normal examined duodenum. Biopsied. Recommendation:           - Patient has a contact number available for                            emergencies. The signs and symptoms of potential  delayed complications were discussed with the                            patient. Return to normal activities tomorrow.                            Written discharge instructions were provided to the                            patient.                           - Resume previous diet.                           - Continue present medications.                           - Await pathology results.                           - Return to GI clinic in 3-6 months or sooner as                            needed.                           - Colonoscopy today.                           - If the biopsies from the gastric nodule are                            unrevealing, will plan to discuss with the advanced                            biliary service about Endoscopic Ultrasound (EUS). Gerrit Heck, MD 03/24/2021 2:55:06 PM

## 2021-03-24 NOTE — Progress Notes (Signed)
Report given to PACU, vss 

## 2021-03-24 NOTE — Progress Notes (Signed)
GASTROENTEROLOGY PROCEDURE H&P NOTE   Primary Care Physician: [name], MD    Reason for Procedure:  Crohn's disease, family history of colon cancer, gastric nodule  Plan:    EGD, colonoscopy  Patient is appropriate for endoscopic procedure(s) in the ambulatory ([place]) setting.  The nature of the procedure, as well as the risks, benefits, and alternatives were carefully and thoroughly reviewed with the patient. Ample time for discussion and questions allowed. The patient understood, was satisfied, and agreed to proceed.     HPI: Christopher Nguyen is a 36 y.o. male who presents for EGD and colonoscopy.  EGD for evaluation of gastric subepithelial nodule and increased intraepithelial lymphocytes with villous flattening on duodenal biopsies on prior EGD (rule out upper tract Crohn's).  Colonoscopy for evaluation of Crohn's Colitis to assess for deep remission and response to therapy.  Currently being treated with Asacol.  Family history is also notable for father with colon cancer.  Endoscopic History: -EGD (05/01/2020, Dr. Bryan Lemma): 6 mm SEL in the antrum (path: Gastritis without dysplasia/metaplasia), Otherwise normal stomach (biopsies negative for H. pylori) and duodenum (biopsies: Increased IELs without villous flattening).  Repeat endoscopy in 1 year for surveillance of antral nodule -Colonoscopy (05/01/2020, Dr. Bryan Lemma): Patchy mild colitis from sigmoid to cecum, with areas of normal mucosa (path: Moderate to severe active, chronic colitis).  Rectal sparing, with transition to normal mucosa at 18 cm.  Normal TI.  Past Medical History:  Diagnosis Date   Azoospermia due to obstruction    Crohn disease ([place])    Hypertension    Low testosterone     Past Surgical History:  Procedure Laterality Date   CLUB FOOT RELEASE  infant   COLONOSCOPY     TESTICLE BIOPSY Bilateral 01/28/2015   Procedure: BIOPSY TESTICULAR;  Surgeon: Carolan Clines, MD;  Location: [place];  Service: Urology;  Laterality: Bilateral;   UPPER GASTROINTESTINAL ENDOSCOPY      Prior to Admission medications   Medication Sig Start Date End Date Taking? Authorizing Provider  amLODipine (NORVASC) 5 MG tablet Take 1 tablet (5 mg total) by mouth daily. 02/26/21  Yes Copland, Gay Filler, MD  Mesalamine (ASACOL HD) 800 MG TBEC Take 1 tablet (800 mg total) by mouth in the morning, at noon, and at bedtime. 02/18/21 03/24/21 Yes Marrietta Thunder V, DO  testosterone cypionate (DEPOTESTOSTERONE CYPIONATE) 200 MG/ML injection Inject 100 mg into the muscle every 7 (seven) days. 08/06/19  Yes [provider]  ALPRAZolam (XANAX) 0.25 MG tablet Take 1 tablet (0.25 mg total) by mouth 2 (two) times daily as needed (panic attack). 02/26/21   Copland, Gay Filler, MD  Multiple Vitamin (MULTIVITAMIN) tablet Take 1 tablet by mouth daily.    [provider]    Current Outpatient Medications  Medication Sig Dispense Refill   amLODipine (NORVASC) 5 MG tablet Take 1 tablet (5 mg total) by mouth daily. 30 tablet 3   Mesalamine (ASACOL HD) 800 MG TBEC Take 1 tablet (800 mg total) by mouth in the morning, at noon, and at bedtime. 90 tablet 11   testosterone cypionate (DEPOTESTOSTERONE CYPIONATE) 200 MG/ML injection Inject 100 mg into the muscle every 7 (seven) days.     ALPRAZolam (XANAX) 0.25 MG tablet Take 1 tablet (0.25 mg total) by mouth 2 (two) times daily as needed (panic attack). 20 tablet 0   Multiple Vitamin (MULTIVITAMIN) tablet Take 1 tablet by mouth daily.     Current Facility-Administered Medications  Medication Dose Route  Frequency Provider Last Rate Last Admin   0.9 %  sodium chloride infusion  500 mL Intravenous Once Rosamond Andress V, DO        Allergies as of 03/24/2021 - Review Complete 03/24/2021  Allergen Reaction Noted   Bentyl [dicyclomine] Swelling 02/26/2021   Cephalosporins Other (See Comments) 01/24/2015    Family History  Problem Relation Age of Onset    Bladder Cancer Mother    Colon cancer Father    Hypertension Father    Hypertension Brother    Melanoma Brother    Alcoholism Maternal Grandmother    Alcohol abuse Maternal Grandfather    Alcoholism Maternal Grandfather    Esophageal cancer Neg Hx    Rectal cancer Neg Hx    Stomach cancer Neg Hx     Social History   Socioeconomic History   Marital status: Married    Spouse name: Not on file   Number of children: 2   Years of education: Not on file   Highest education level: Not on file  Occupational History   Occupation: Chiropodist  Tobacco Use   Smoking status: Never   Smokeless tobacco: Never  Vaping Use   Vaping Use: Never used  Substance and Sexual Activity   Alcohol use: Yes    Comment: weekly   Drug use: No   Sexual activity: Not on file  Other Topics Concern   Not on file  Social History Narrative   Not on file   Social Determinants of Health   Financial Resource Strain: Not on file  Food Insecurity: Not on file  Transportation Needs: Not on file  Physical Activity: Not on file  Stress: Not on file  Social Connections: Not on file  Intimate Partner Violence: Not on file    Physical Exam: Vital signs in last 24 hours: @BP  125/77    Pulse 91    Temp 99.3 F (37.4 C)    Ht 5\' 3"  (1.6 m)    Wt 167 lb (75.8 kg)    SpO2 100%    BMI 29.58 kg/m  GEN: NAD EYE: Sclerae anicteric ENT: MMM CV: Non-tachycardic Pulm: CTA b/l GI: Soft, NT/ND NEURO:  Alert & Oriented x [place], DO [facility]   03/24/2021 2:14 PM

## 2021-03-24 NOTE — Patient Instructions (Signed)
Upper Endoscopy:   Await biopsy results   Return for GI office visit in 3-6 months,sooner as needed- make this appointment    Colonoscopy:  Await biopsy results   Return to GI office in 3-6 months -sooner as needed -make this appointment    YOU HAD AN ENDOSCOPIC PROCEDURE TODAY AT [facility]:   Refer to the procedure report that was given to you for any specific questions about what was found during the examination.  If the procedure report does not answer your questions, please call your gastroenterologist to clarify.  If you requested that your care partner not be given the details of your procedure findings, then the procedure report has been included in a sealed envelope for you to review at your convenience later.  YOU SHOULD EXPECT: Some feelings of bloating in the abdomen. Passage of more gas than usual.  Walking can help get rid of the air that was put into your GI tract during the procedure and reduce the bloating. If you had a lower endoscopy (such as a colonoscopy or flexible sigmoidoscopy) you may notice spotting of blood in your stool or on the toilet paper. If you underwent a bowel prep for your procedure, you may not have a normal bowel movement for a few days.  Please Note:  You might notice some irritation and congestion in your nose or some drainage.  This is from the oxygen used during your procedure.  There is no need for concern and it should clear up in a day or so.  SYMPTOMS TO REPORT IMMEDIATELY:  Following lower endoscopy (colonoscopy or flexible sigmoidoscopy):  Excessive amounts of blood in the stool  Significant tenderness or worsening of abdominal pains  Swelling of the abdomen that is new, acute  Fever of 100F or higher  Following upper endoscopy (EGD)  Vomiting of blood or coffee ground material  New chest pain or pain under the shoulder blades  Painful or persistently difficult swallowing  New shortness of breath  Fever of 100F  or higher  Black, tarry-looking stools  For urgent or emergent issues, a gastroenterologist can be reached at any hour by calling [phone]. Do not use MyChart messaging for urgent concerns.    DIET:  We do recommend a small meal at first, but then you may proceed to your regular diet.  Drink plenty of fluids but you should avoid alcoholic beverages for 24 hours.  ACTIVITY:  You should plan to take it easy for the rest of today and you should NOT DRIVE or use heavy machinery until tomorrow (because of the sedation medicines used during the test).    FOLLOW UP: Our staff will call the number listed on your records 48-72 hours following your procedure to check on you and address any questions or concerns that you may have regarding the information given to you following your procedure. If we do not reach you, we will leave a message.  We will attempt to reach you two times.  During this call, we will ask if you have developed any symptoms of COVID 19. If you develop any symptoms (ie: fever, flu-like symptoms, shortness of breath, cough etc.) before then, please call [phone].  If you test positive for Covid 19 in the 2 weeks post procedure, please call and report this information to [place].    If any biopsies were taken you will be contacted by phone or by letter within the next 1-3 weeks.  Please call us at [phone] if you have not heard about the biopsies in 3 weeks.    SIGNATURES/CONFIDENTIALITY: You and/or your care partner have signed paperwork which will be entered into your electronic medical record.  These signatures attest to the fact that that the information above on your After Visit Summary has been reviewed and is understood.  Full responsibility of the confidentiality of this discharge information lies with you and/or your care-partner.

## 2021-03-24 NOTE — Op Note (Signed)
[facility] Patient Name: Christopher Nguyen Procedure Date: 03/24/2021 1:49 PM MRN: [id number] Endoscopist: Gerrit Heck , MD Age: 36 Referring MD:  Date of Birth: 1985/04/01 Gender: Male Account #: [id number] Procedure:                Colonoscopy Indications:              Follow-up of Crohn's disease of the colon, Assess                            therapeutic response to therapy of Crohn's disease                            of the colon                           Colonoscopy on 05/01/2020 notable for patchy mild                            colitis from sigmoid to cecum, with areas of normal                            mucosa (path: Moderate to severe active, chronic                            colitis). Rectal sparing, with transition to normal                            mucosa at 18 cm. Normal TI. Was started on Asacol                            with good clinical response. He presents today to                            assess for deep remission and grade overall                            response to therapy. Medicines:                Monitored Anesthesia Care Procedure:                Pre-Anesthesia Assessment:                           - Prior to the procedure, a History and Physical                            was performed, and patient medications and                            allergies were reviewed. The patient's tolerance of                            previous anesthesia was also reviewed. The risks  and benefits of the procedure and the sedation                            options and risks were discussed with the patient.                            All questions were answered, and informed consent                            was obtained. Prior Anticoagulants: The patient has                            taken no previous anticoagulant or antiplatelet                            agents. ASA Grade Assessment: II - A patient with                             mild systemic disease. After reviewing the risks                            and benefits, the patient was deemed in                            satisfactory condition to undergo the procedure.                           After obtaining informed consent, the colonoscope                            was passed under direct vision. Throughout the                            procedure, the patient's blood pressure, pulse, and                            oxygen saturations were monitored continuously. The                            CF [id number] #[id number] was introduced through the anus                            and advanced to the the cecum, identified by                            appendiceal orifice and ileocecal valve. The                            colonoscopy was performed without difficulty. The                            patient tolerated the procedure well. The quality  of the bowel preparation was good. The ileocecal                            valve, appendiceal orifice, and rectum were                            photographed. Scope In: 2:31:19 PM Scope Out: 2:44:56 PM Scope Withdrawal Time: 0 hours 10 minutes 16 seconds  Total Procedure Duration: 0 hours 13 minutes 37 seconds  Findings:                 The perianal and digital rectal examinations were                            normal.                           Localized mild inflammation characterized by                            erythema was found at the appendiceal orifice.                            Biopsies were taken with a cold forceps for                            histology. Estimated blood loss was minimal.                           Normal mucosa was found in the rectum, in the                            recto-sigmoid colon, in the sigmoid colon, in the                            descending colon, in the transverse colon, in the                            ascending colon and in the remainder of the  cecum.                            Biopsies were taken throughout the colon with a                            cold forceps for histology. Estimated blood loss                            was minimal.                           The retroflexed view of the distal rectum and anal                            verge was normal and showed no anal or rectal  abnormalities. Complications:            No immediate complications. Estimated Blood Loss:     Estimated blood loss was minimal. Impression:               - Localized patch of mild inflammation was found at                            the appendiceal orifice. This was biopsied.                            Otherwise, the remainder of the colon was normal                            appearing throughout. The previously noted                            pancolitis has healed well on current therapy. The                            rest of the colon was extensively biopsied to                            evaluate for deep remission and dysplasia screening.                           - The distal rectum and anal verge are normal on                            retroflexion view. Recommendation:           - Patient has a contact number available for                            emergencies. The signs and symptoms of potential                            delayed complications were discussed with the                            patient. Return to normal activities tomorrow.                            Written discharge instructions were provided to the                            patient.                           - Resume previous diet.                           - Continue present medications.                           - Await pathology results.                           -  Follow-up in the GI Clinic in 3-6 months or                            sooner as needed.                           - Repeat fecal calprotectin now to evaluate for                             reliable surrogate marker of improved/resolving                            inflammation. The ESR and CRP were otherwise normal                            during diagnosis, so are less reliable markers in                            this patient. Gerrit Heck, MD 03/24/2021 3:03:41 PM

## 2021-03-24 NOTE — Progress Notes (Signed)
Called to room to assist during endoscopic procedure.  Patient ID and intended procedure confirmed with present staff. Received instructions for my participation in the procedure from the performing physician.  

## 2021-03-24 NOTE — Progress Notes (Signed)
1419 Robinul 0.1 mg IV given due large amount of secretions upon assessment.  MD made aware, vss

## 2021-03-26 ENCOUNTER — Telehealth: Payer: Self-pay

## 2021-03-26 ENCOUNTER — Other Ambulatory Visit: Payer: Self-pay

## 2021-03-26 DIAGNOSIS — K50119 Crohn's disease of large intestine with unspecified complications: Secondary | ICD-10-CM

## 2021-03-26 NOTE — Telephone Encounter (Signed)
°  Follow up Call-  Call back number 03/24/2021 05/01/2020  Post procedure Call Back phone  # [phone]  Permission to leave phone message Yes Yes  Some recent data might be hidden     Patient questions:  Do you have a fever, pain , or abdominal swelling? No. Pain Score  0 *  Have you tolerated food without any problems? Yes.    Have you been able to return to your normal activities? Yes.    Do you have any questions about your discharge instructions: Diet   No. Medications  No. Follow up visit  No.  Do you have questions or concerns about your Care? No.  Actions: * If pain score is 4 or above: No action needed, pain <4.

## 2021-03-26 NOTE — Telephone Encounter (Signed)
Colonoscopy report from 03/24/21 states pt needs repeat fecal calprotectin now and a follow up appt in 3-6 months. Placed order for fecal calprotectin and a 3 month recall for office appt. Called pt to let him know that he needs a repeat fecal calprotectin and that he would be notified in 3 months for office appt. Pt verbalized understanding and stated it would probably be next week before he would be able to complete test.

## 2021-04-11 ENCOUNTER — Telehealth: Payer: Self-pay

## 2021-04-11 NOTE — Telephone Encounter (Signed)
-----   Message from Irving Copas., MD sent at 04/10/2021  5:01 PM EST ----- VC, Sounds reasonable with you having done the repeat endoscopy in coming back similar. We can perform an EUS at some point in the next 6 months and do gastric mapping as well.  Patty, Please put this patient on a recall for a 4 to 9-month EGD/EUS with gastric mapping with DJ or myself. Thanks. GM ----- Message ----- From: Lavena Bullion, DO Sent: 04/10/2021   4:06 PM EST To: Milus Banister, MD, #  Gents-  Please see if you agree about doing upper EUS for this patient.  Can be done routine, nothing urgent about it.    He has a 6 mm SEL in the gastric antrum which was there on EGD last year (tunneled biopsies with gastritis, negative for H. pylori) and again earlier this month (tunneled biopsies again with gastritis, but also with focal intestinal metaplasia).  No change in size on endoscopy.  Thank you.  [employer]

## 2021-04-11 NOTE — Telephone Encounter (Signed)
Recall has been entered for EUS within the next 6 months

## 2021-04-11 NOTE — Telephone Encounter (Signed)
Recall placed

## 2021-04-11 NOTE — Telephone Encounter (Signed)
-----   Message from Irving Copas., MD sent at 04/11/2021  3:03 PM EST ----- No worries. Maite Burlison, please schedule EGD/EUS for subepithelial gastric lesion and gastric mapping for intestinal metaplasia in 4 to 6 months.. Thanks. GM ----- Message ----- From: Lavena Bullion, DO Sent: 04/11/2021   7:41 AM EST To: Milus Banister, MD, Timothy Lasso, RN, #  Thank you!  VC  ----- Message ----- From: Irving Copas., MD Sent: 04/10/2021   5:02 PM EST To: Milus Banister, MD, Timothy Lasso, RN, #  VC, Sounds reasonable with you having done the repeat endoscopy in coming back similar. We can perform an EUS at some point in the next 6 months and do gastric mapping as well.  [name], Please put this patient on a recall for a 4 to 22-month EGD/EUS with gastric mapping with DJ or myself. Thanks. GM ----- Message ----- From: Lavena Bullion, DO Sent: 04/10/2021   4:06 PM EST To: Milus Banister, MD, #  Gents-  Please see if you agree about doing upper EUS for this patient.  Can be done routine, nothing urgent about it.    He has a 6 mm SEL in the gastric antrum which was there on EGD last year (tunneled biopsies with gastritis, negative for H. pylori) and again earlier this month (tunneled biopsies again with gastritis, but also with focal intestinal metaplasia).  No change in size on endoscopy.  Thank you.  [employer]

## 2021-04-15 ENCOUNTER — Telehealth: Payer: Self-pay | Admitting: General Surgery

## 2021-04-15 NOTE — Telephone Encounter (Signed)
Left a detailed message explaining his results, also expressed the gastric nodule did not represent cancer put did have some cellular changes and it would be discussed with the GI advanced service. patient was told to come to [facility] to pick up a stool kit and return to the clinic in 3 to 6 months. Patient told to call with any questions.

## 2021-04-15 NOTE — Telephone Encounter (Signed)
-----   Message from [place], DO sent at 04/10/2021  4:04 PM EST ----- Biopsies from the recent procedures are notable for the following: Upper Endoscopy: -The biopsies taken from your small intestine were normal and there was no evidence of Celiac Disease.  -The biopsies from the gastric nodule demonstrate inflammation and focal intestinal metaplasia (cellular changes).  This does NOT represent cancer.  There is also no evidence of Helicobacter pylori infection.  Colonoscopy: - The biopsies demonstrate chronic, active colitis (inflammation) in the cecum, and chronic colitis in the ascending colon, consistent with known Crohn's Disease. - The biopsies throughout the remainder of the colon were otherwise normal. - Overall, these biopsies represent good improvement on current therapy with variable phases of mucosal healing.  No recommended change in therapy.  I would proceed with fecal calprotectin as recommended at time of colonoscopy, which can be used moving forward as a surrogate marker of inflammation.  Plan for follow-up in the GI clinic in 3-6 months, or sooner as needed.  In the meantime, I will discuss the gastric nodule with the advanced GI service with consideration for Endoscopic Ultrasound.

## 2021-04-16 ENCOUNTER — Other Ambulatory Visit: Payer: Self-pay | Admitting: Family Medicine

## 2021-04-16 DIAGNOSIS — I10 Essential (primary) hypertension: Secondary | ICD-10-CM

## 2021-05-02 ENCOUNTER — Encounter: Payer: Self-pay | Admitting: Family Medicine

## 2021-05-10 NOTE — Progress Notes (Signed)
Therapist, music at [facility] ?[address], [address] ?[place], [place] [address] ?336 [phone] ?Fax [phone] ? ?Date:  05/14/2021  ? ?Name:  Christopher Nguyen   DOB:  [birth year]   MRN:  [id number] ? ?PCP:  [name], MD  ? ? ?Chief Complaint: No chief complaint on file. ? ? ?History of Present Illness: ? ?Christopher Nguyen is a 36 y.o. very pleasant male patient who presents with the following: ? ?Virtual visit today to discuss anxiety ?Patient location is home, my location is office.  Patient identity confirmed with 2 factors, he gives consent for virtual visit today ?The patient and myself are present on the call today ?History of Crohn's disease, hypertension ? ?Most recent visit with myself was in January; at that time he had high blood pressure and possible panic attacks.  We adjusted his blood pressure medication and BP improved.  However he notes he continues to have anxiety ?He was given alprazolam for panic attacks but would like a better long-term solution ? ?Pt notes the alprazolam did help with his anxiety and takes away the worst of the panic  ?He has been under more stress- his wife's job has been changing and he is under more pressure at work ?He may notice he is trying to relax but cannot due to anxiety ? ?He may use a xanax once or twice weekly for panic ?However he is dealing with anxiety about 20 % of the time - work is a major stressor and he may have some panic at night also  ? ?He has been on wellbutrin in the past - however he did feel like this made him more anxious ?Depression is not a major feature at this time ?Sleep is mostly ok- an occasional restless night- they do have a 36 yo which can contribute ?Appetite is ok ?No SI ? ?His father died from colon cancer not longer, patient himself has Crohn's disease ?Colonoscopy done last month ? ?Patient Active Problem List  ? Diagnosis Date Noted  ? Crohn's disease ([place]) 11/09/2020  ? Overweight 09/18/2015  ? Essential  hypertension, benign 09/18/2015  ? ? ?Past Medical History:  ?Diagnosis Date  ? Azoospermia due to obstruction   ? Crohn disease ([place])   ? Hypertension   ? Low testosterone   ? ? ?Past Surgical History:  ?Procedure Laterality Date  ? CLUB FOOT RELEASE  infant  ? COLONOSCOPY    ? TESTICLE BIOPSY Bilateral 01/28/2015  ? Procedure: BIOPSY TESTICULAR;  Surgeon: Carolan Clines, MD;  Location: [facility];  Service: Urology;  Laterality: Bilateral;  ? UPPER GASTROINTESTINAL ENDOSCOPY    ? ? ?Social History  ? ?Tobacco Use  ? Smoking status: Never  ? Smokeless tobacco: Never  ?Vaping Use  ? Vaping Use: Never used  ?Substance Use Topics  ? Alcohol use: Yes  ?  Comment: weekly  ? Drug use: No  ? ? ?Family History  ?Problem Relation Age of Onset  ? Bladder Cancer Mother   ? Colon cancer Father   ? Hypertension Father   ? Hypertension Brother   ? Melanoma Brother   ? Alcoholism Maternal Grandmother   ? Alcohol abuse Maternal Grandfather   ? Alcoholism Maternal Grandfather   ? Esophageal cancer Neg Hx   ? Rectal cancer Neg Hx   ? Stomach cancer Neg Hx   ? ? ?Allergies  ?Allergen Reactions  ? Bentyl [Dicyclomine] Swelling  ? Cephalosporins Other (See Comments)  ?  Unknown childhood reaction  ? ? ?Medication list has been reviewed and updated. ? ?Current Outpatient Medications on File Prior to Visit  ?Medication Sig Dispense Refill  ? ALPRAZolam (XANAX) 0.25 MG tablet Take 1 tablet (0.25 mg total) by mouth 2 (two) times daily as needed (panic attack). 20 tablet 0  ? amLODipine (NORVASC) 5 MG tablet TAKE ONE TABLET BY MOUTH ONE TIME DAILY 30 tablet 3  ? Mesalamine (ASACOL HD) 800 MG TBEC Take 1 tablet (800 mg total) by mouth in the morning, at noon, and at bedtime. 90 tablet 11  ? Multiple Vitamin (MULTIVITAMIN) tablet Take 1 tablet by mouth daily.    ? testosterone cypionate (DEPOTESTOSTERONE CYPIONATE) 200 MG/ML injection Inject 100 mg into the muscle every 7 (seven) days.    ? ?No current  facility-administered medications on file prior to visit.  ? ? ?Review of Systems: ? ?As per HPI- otherwise negative. ? ? ?Physical Examination: ?There were no vitals filed for this visit. ?There were no vitals filed for this visit. ?There is no height or weight on file to calculate BMI. ?Ideal Body Weight:   ? ?Patient observed via video monitor.  He looks well, his normal self.  No shortness of breath or distress is noted ? ?Assessment and Plan: ?GAD (generalized anxiety disorder) - Plan: FLUoxetine (PROZAC) 20 MG capsule ? ? ?Patient seen today for anxiety, video used for duration of visit today.  He is currently using alprazolam perhaps once or twice a week which is helpful.  However he would like a daily anxiety reducing medication.  We started to start fluoxetine 20, discussed most common side effects.  I asked him to check back with me via MyChart in about a month, with a formal visit in 3 months.  He agrees with this plan-will let me know sooner if any problems ? ?He also like to see a counselor, I sent in a MyChart message with several suggestions ? ?Signed ?Lamar Blinks, MD ? ?

## 2021-05-14 ENCOUNTER — Telehealth (INDEPENDENT_AMBULATORY_CARE_PROVIDER_SITE_OTHER): Payer: BC Managed Care – PPO | Admitting: Family Medicine

## 2021-05-14 ENCOUNTER — Encounter: Payer: Self-pay | Admitting: Family Medicine

## 2021-05-14 DIAGNOSIS — F411 Generalized anxiety disorder: Secondary | ICD-10-CM

## 2021-05-14 MED ORDER — FLUOXETINE HCL 20 MG PO CAPS: 20.0000 mg | ORAL_CAPSULE | Freq: Every day | ORAL | 3 refills | Status: DC

## 2021-07-07 ENCOUNTER — Encounter: Payer: Self-pay | Admitting: Gastroenterology

## 2021-07-21 DIAGNOSIS — E291 Testicular hypofunction: Secondary | ICD-10-CM | POA: Diagnosis not present

## 2021-07-28 DIAGNOSIS — E291 Testicular hypofunction: Secondary | ICD-10-CM | POA: Diagnosis not present

## 2021-08-29 ENCOUNTER — Other Ambulatory Visit: Payer: Self-pay

## 2021-08-29 DIAGNOSIS — K3189 Other diseases of stomach and duodenum: Secondary | ICD-10-CM

## 2021-08-29 NOTE — Telephone Encounter (Signed)
EUS scheduled for 10/15/21 at at 915 am at [facility] with GM   Left message on machine to call back

## 2021-09-01 NOTE — Telephone Encounter (Signed)
Tried to reach pt and mail box full

## 2021-09-02 NOTE — Telephone Encounter (Signed)
Mailbox still full.  Will try later. All information has also been mailed and sent to My Chart

## 2021-09-03 NOTE — Telephone Encounter (Signed)
The pt has viewed the information in My Chart

## 2021-09-03 NOTE — Telephone Encounter (Signed)
I have been unable to reach the pt by phone.  All information sent to the pt via mail and My Chart

## 2021-09-09 ENCOUNTER — Encounter: Payer: Self-pay | Admitting: Gastroenterology

## 2021-09-11 ENCOUNTER — Other Ambulatory Visit (INDEPENDENT_AMBULATORY_CARE_PROVIDER_SITE_OTHER): Payer: BC Managed Care – PPO

## 2021-09-11 ENCOUNTER — Ambulatory Visit: Payer: BC Managed Care – PPO | Admitting: Physician Assistant

## 2021-09-11 ENCOUNTER — Encounter: Payer: Self-pay | Admitting: Physician Assistant

## 2021-09-11 VITALS — BP 130/90 | HR 72 | Ht 62.0 in | Wt 164.4 lb

## 2021-09-11 DIAGNOSIS — K50119 Crohn's disease of large intestine with unspecified complications: Secondary | ICD-10-CM | POA: Diagnosis not present

## 2021-09-11 DIAGNOSIS — K3189 Other diseases of stomach and duodenum: Secondary | ICD-10-CM

## 2021-09-11 LAB — CBC WITH DIFFERENTIAL/PLATELET
Basophils Absolute: 0 10*3/uL (ref 0.0–0.1)
Basophils Relative: 0.7 % (ref 0.0–3.0)
Eosinophils Absolute: 0 10*3/uL (ref 0.0–0.7)
Eosinophils Relative: 0.5 % (ref 0.0–5.0)
HCT: 41.4 % (ref 39.0–52.0)
Hemoglobin: 13.9 g/dL (ref 13.0–17.0)
Lymphocytes Relative: 31 % (ref 12.0–46.0)
Lymphs Abs: 2.1 10*3/uL (ref 0.7–4.0)
MCHC: 33.5 g/dL (ref 30.0–36.0)
MCV: 83.4 fl (ref 78.0–100.0)
Monocytes Absolute: 0.6 10*3/uL (ref 0.1–1.0)
Monocytes Relative: 8.3 % (ref 3.0–12.0)
Neutro Abs: 4 10*3/uL (ref 1.4–7.7)
Neutrophils Relative %: 59.5 % (ref 43.0–77.0)
Platelets: 384 10*3/uL (ref 150.0–400.0)
RBC: 4.96 Mil/uL (ref 4.22–5.81)
RDW: 13.1 % (ref 11.5–15.5)
WBC: 6.7 10*3/uL (ref 4.0–10.5)

## 2021-09-11 LAB — COMPREHENSIVE METABOLIC PANEL
ALT: 14 U/L (ref 0–53)
AST: 18 U/L (ref 0–37)
Albumin: 4.9 g/dL (ref 3.5–5.2)
Alkaline Phosphatase: 60 U/L (ref 39–117)
BUN: 16 mg/dL (ref 6–23)
CO2: 27 mEq/L (ref 19–32)
Calcium: 9.9 mg/dL (ref 8.4–10.5)
Chloride: 102 mEq/L (ref 96–112)
Creatinine, Ser: 0.94 mg/dL (ref 0.40–1.50)
GFR: 104.3 mL/min (ref >60.00)
Glucose, Bld: 87 mg/dL (ref 70–99)
Potassium: 4.4 mEq/L (ref 3.5–5.1)
Sodium: 138 mEq/L (ref 135–145)
Total Bilirubin: 0.5 mg/dL (ref 0.2–1.2)
Total Protein: 8.3 g/dL (ref 6.0–8.3)

## 2021-09-11 MED ORDER — PREDNISONE 10 MG PO TABS: ORAL_TABLET | ORAL | 0 refills | Status: AC

## 2021-09-11 NOTE — Patient Instructions (Addendum)
We have sent the following medications to your pharmacy for you to pick up at your convenience: Prednisone taper.   Your provider has requested that you go to the basement level for lab work before leaving today. Press "B" on the elevator. The lab is located at the first door on the left as you exit the elevator.  If you are age 36 or older, your body mass index should be between 23-30. Your Body mass index is 30.06 kg/m. If this is out of the aforementioned range listed, please consider follow up with your Primary Care Provider.  If you are age 69 or younger, your body mass index should be between 19-25. Your Body mass index is 30.06 kg/m. If this is out of the aformentioned range listed, please consider follow up with your Primary Care Provider.   ________________________________________________________  The  GI providers would like to encourage you to use [facility] to communicate with providers for non-urgent requests or questions.  Due to long hold times on the telephone, sending your provider a message by [facility] may be a faster and more efficient way to get a response.  Please allow 48 business hours for a response.  Please remember that this is for non-urgent requests.  _______________________________________________________

## 2021-09-11 NOTE — Progress Notes (Signed)
Chief Complaint: Crohn's disease with abdominal pain, diarrhea and fatigue as well as appetite loss  GI History: 33 male with history of HTN, initially seen in the GI clinic on 03/28/2020 for evaluation of RUQ pain, change in bowel habits, nonbloody diarrhea and subsequently diagnosed with Crohn's Disease as below.   1) RUQ pain, biliary dyskinesia: Started 07/2019. Typically post prandial RUQ pain and also post prandial BM. Sxs intermittent. No radiation, n/v/f/c.  Rare episodes that wake him from sleep. No improvement with trial of Prilosec.  -RUQ [place] (08/2019): Fatty liver, otherwise normal -08/2019: WBC 11.5, otherwise normal CBC, CMP, acute viral hep panel -02/2020: WBC 10.6, otherwise normal CBC, CMP, amylase, lipase -04/2020: Normal tTG -04/2020: HIDA scan: EF 8% consistent with biliary dyskinesia/chronic cholecystitis.  Referred to General Surgery, patient declined referral   2) Crohn's Disease (Crohn's Colitis): Diagnosed by colonoscopy in 04/2020.  Index symptoms of 6-7 loose/watery, nonbloody stools per day, +urgency. +mucus-like stools, with associated RUQ pain.   -04/2020: Fecal calprotectin 915.  Normal ESR, CRP, celiac panel.  Negative GI PCR panel -Colonoscopy 04/2020 with mild patchy colitis from sigmoid to cecum with areas of normal mucosa.  Biopsies consistent with active, chronic colitis.  Relative rectal sparing, all suggestive of Crohn's colitis.  Normal TI.  Prescribed Lialda 4.8 g/day, but changed to Asacol HD due to patient concerns re tablet size - 05/2020: GI follow-up.  Reported clinical improvement since starting Asacol HD. - 08/2020: Fecal calprotectin 38   3) Family history of colon cancer: Father with colon cancer, diagnosed in his 56s and died at age 27   4) Hepatic steatosis: Incidentally noted on RUQ [place]   Endoscopic History: -EGD (05/01/2020, Dr. Bryan Lemma): 6 mm SEL in the antrum (path: Gastritis without dysplasia/metaplasia), Otherwise normal stomach (biopsies  negative for H. pylori) and duodenum (biopsies: Increased IELs without villous flattening).  Repeat endoscopy in 1 year for surveillance of antral nodule -Colonoscopy (05/01/2020, Dr. Bryan Lemma): Patchy mild colitis from sigmoid to cecum, with areas of normal mucosa (path: Moderate to severe active, chronic colitis).  Rectal sparing, with transition to normal mucosa at 18 cm.  Normal TI. -EGD 03/24/2021 Dr. Bryan Lemma: With a single submucosal nodule found in the stomach biopsy showed a gastric nodule demonstrating inflammation and focal intestinal metaplasia and repeat EUS/EGD was recommended in 3 to 6 months -Colonoscopy 03/24/2021 Dr. Bryan Lemma: Localized inflammation at the appendiceal orifice, biopsy showed chronic active colitis in the cecum and chronic colitis in the ascending colon consistent with Crohn's disease  HPI:    Christopher Nguyen is a 36 year old Caucasian male with a past medical history as listed below including Crohn's disease, who was referred to me by Copland, Gay Filler, MD for a complaint of Crohn's disease with abdominal pain, diarrhea and fatigue as well as appetite loss.      02/18/2021 patient seen in clinic by Dr. Bryan Lemma for follow-up.  Had previously been doing well on Aza call HD.  He had felt well tolerating Aza call with occasional "many flares" with watery, nonbloody stools that lasted for a few days at a time.  That visit continued on He is a call.  Recommended repeat colonoscopy to evaluate for deep remission and repeat upper endoscopy due to increased duodenal IEL's on previous study with plan to rule out upper tract Crohn's.    09/09/2021 patient noted he been having increase in symptoms for 3 weeks of diarrhea, pain in the right and left upper quadrant and stomach as well as a loss  of appetite and fatigue.  The Mesalamine was not alleviating symptoms.    Today, patient tells me that over the past 3 weeks or so he feels like he is having a flare with increased loose stools at  least 5-6 times a day and urgency as well as a decrease in appetite and feeling bloated after eating with some upper abdominal pain rated as a 6-7/10.  Tells me this is very similar to the symptoms he had when diagnosed but "not quite as severe".  Denies seeing any blood in his stool.  Currently taking his Asacol 800 mg 3 times daily.    Denies fever, chills, weight loss or symptoms that awaken him from sleep.  Past Medical History:  Diagnosis Date   Azoospermia due to obstruction    Crohn disease ([place])    Hypertension    Low testosterone     Past Surgical History:  Procedure Laterality Date   CLUB FOOT RELEASE  infant   COLONOSCOPY     TESTICLE BIOPSY Bilateral 01/28/2015   Procedure: BIOPSY TESTICULAR;  Surgeon: Carolan Clines, MD;  Location: [facility];  Service: Urology;  Laterality: Bilateral;   UPPER GASTROINTESTINAL ENDOSCOPY      Current Outpatient Medications  Medication Sig Dispense Refill   ALPRAZolam (XANAX) 0.25 MG tablet Take 1 tablet (0.25 mg total) by mouth 2 (two) times daily as needed (panic attack). 20 tablet 0   amLODipine (NORVASC) 5 MG tablet TAKE ONE TABLET BY MOUTH ONE TIME DAILY 30 tablet 3   FLUoxetine (PROZAC) 20 MG capsule Take 1 capsule (20 mg total) by mouth daily. 90 capsule 3   Mesalamine (ASACOL HD) 800 MG TBEC Take 1 tablet (800 mg total) by mouth in the morning, at noon, and at bedtime. 90 tablet 11   Multiple Vitamin (MULTIVITAMIN) tablet Take 1 tablet by mouth daily.     testosterone cypionate (DEPOTESTOSTERONE CYPIONATE) 200 MG/ML injection Inject 100 mg into the muscle every 7 (seven) days.     No current facility-administered medications for this visit.    Allergies as of 09/11/2021 - Review Complete 09/11/2021  Allergen Reaction Noted   Bentyl [dicyclomine] Swelling 02/26/2021   Cephalosporins Other (See Comments) 01/24/2015    Family History  Problem Relation Age of Onset   Bladder Cancer Mother    Colon cancer  Father    Hypertension Father    Hypertension Brother    Melanoma Brother    Alcoholism Maternal Grandmother    Alcohol abuse Maternal Grandfather    Alcoholism Maternal Grandfather    Esophageal cancer Neg Hx    Rectal cancer Neg Hx    Stomach cancer Neg Hx     Social History   Socioeconomic History   Marital status: Married    Spouse name: Not on file   Number of children: 2   Years of education: Not on file   Highest education level: Not on file  Occupational History   Occupation: Chiropodist  Tobacco Use   Smoking status: Never   Smokeless tobacco: Never  Vaping Use   Vaping Use: Never used  Substance and Sexual Activity   Alcohol use: Yes    Comment: weekly   Drug use: No   Sexual activity: Not on file  Other Topics Concern   Not on file  Social History Narrative   Not on file   Social Determinants of Health   Financial Resource Strain: Not on file  Food Insecurity: Not on file  Transportation Needs:  Not on file  Physical Activity: Not on file  Stress: Not on file  Social Connections: Not on file  Intimate Partner Violence: Not on file    Review of Systems:    Constitutional: No weight loss, fever or chills Cardiovascular: No chest pain Respiratory: No SOB  Gastrointestinal: See HPI and otherwise negative   Physical Exam:  Vital signs: BP 130/90 (BP Location: Left Arm, Patient Position: Sitting, Cuff Size: Normal)   Pulse 72   Ht $R'5\' 2"'dm$  (1.575 m) Comment: height measured without shoes  Wt 164 lb 6 oz (74.6 kg)   BMI 30.06 kg/m    Constitutional:   Pleasant Caucasian male appears to be in NAD, Well developed, Well nourished, alert and cooperative Respiratory: Respirations even and unlabored. Lungs clear to auscultation bilaterally.   No wheezes, crackles, or rhonchi.  Cardiovascular: Normal S1, S2. No MRG. Regular rate and rhythm. No peripheral edema, cyanosis or pallor.  Gastrointestinal:  Soft, nondistended, mild upper abdominal TTP,  no rebound or guarding. Normal bowel sounds. No appreciable masses or hepatomegaly. Rectal:  Not performed.  Psychiatric: Oriented to person, place and time. Demonstrates good judgement and reason without abnormal affect or behaviors.  RELEVANT LABS AND IMAGING: CBC    Component Value Date/Time   WBC 11.8 (H) 02/14/2021 1528   RBC 5.83 (H) 02/14/2021 1528   HGB 17.0 02/14/2021 1528   HCT 48.8 02/14/2021 1528   PLT 354 02/14/2021 1528   MCV 83.7 02/14/2021 1528   MCH 29.2 02/14/2021 1528   MCHC 34.8 02/14/2021 1528   RDW 12.1 02/14/2021 1528   LYMPHSABS 2.6 02/14/2021 1528   MONOABS 0.5 02/14/2021 1528   EOSABS 0.1 02/14/2021 1528   BASOSABS 0.1 02/14/2021 1528    CMP     Component Value Date/Time   NA 136 02/14/2021 1528   K 4.1 02/14/2021 1528   CL 99 02/14/2021 1528   CO2 26 02/14/2021 1528   GLUCOSE 100 (H) 02/14/2021 1528   BUN 13 02/14/2021 1528   CREATININE 0.99 02/14/2021 1528   CALCIUM 9.8 02/14/2021 1528   PROT 8.9 (H) 02/14/2021 1528   ALBUMIN 5.1 (H) 02/14/2021 1528   AST 21 02/14/2021 1528   ALT 15 02/14/2021 1528   ALKPHOS 63 02/14/2021 1528   BILITOT 0.9 02/14/2021 1528   GFRNONAA >60 02/14/2021 1528   GFRAA >60 08/31/2019 1238    Assessment: 1.  Crohn's disease with flare: Patient on Assacol 800 3 times daily and now having a flare of abdominal pain, diarrhea, decreased appetite and fatigue 2.  Submucosal nodule in the stomach: Patient already scheduled for EUS/EGD with Dr. Rush Landmark at the end of August  Plan: 1.  Repeat labs today including CBC and CMP.  Also ordered a fecal calprotectin and GI pathogen panel.  (previously noted that ESR and CRP are not good markers for this patient) 2.  Patient will start on Prednisone 40 mg daily x2 weeks and then taper down by 5 mg/week until finished. 3.  Patient is scheduled for an upcoming EUS with Dr. Rush Landmark on August 30.  He will keep this appointment. 4.  Also continue Asacol 800 mg 3 times daily. 5.   Patient is worried about side effects from Prednisone.  Told him to call us if he experiences any of these and discussed in detail.  At that point could consider a faster taper. 6.  Patient to follow in clinic with [place] per recommendations from Dr. Rush Landmark after time of upcoming appointment.  Anderson Malta  Apolonio [facility] 09/11/2021, 11:02 AM  Cc: [name], MD

## 2021-09-18 NOTE — Progress Notes (Signed)
Agree with the assessment and plan as outlined by Ellouise Newer, PA-C.  Depending on labs, inflammatory markers, and response to prednisone, may need to consider escalation of therapy, particularly if breakthrough during prednisone wean or when back on Asacol monotherapy.  To follow-up in the GI clinic after EGD/EUS to evaluate for medication efficacy.  Yordan Martindale, DO, [facility]

## 2021-09-29 ENCOUNTER — Encounter: Payer: Self-pay | Admitting: Gastroenterology

## 2021-09-29 NOTE — Telephone Encounter (Signed)
Dr Bryan Lemma please see the pt concerns regarding the upcoming EUS

## 2021-09-29 NOTE — Telephone Encounter (Signed)
Procedure that is scheduled at [facility] Endoscopy unit is slightly different from what he has had before.  He previously had upper endoscopy x2, which demonstrated 6 mm subepithelial lesion in the gastric antrum.  This procedure is an endoscopic ultrasound.  It is visualization of the deeper layers of the stomach to determine what is the etiology of this nodule, along with resection of the nodule if appropriate based on endosonographic views.  This is a different procedure completely and allows for deeper level visualization and establish the source of that nodule.  I certainly understand the concerns regarding cost.  Since EUS procedures need to be done at the hospital (higher complexity case with specialized equipment), that is why he is being billed as a hospital outpatient procedure.  EUS cannot be done in our outpatient endoscopy unit.

## 2021-10-08 ENCOUNTER — Encounter (HOSPITAL_COMMUNITY): Payer: Self-pay | Admitting: Gastroenterology

## 2021-10-13 ENCOUNTER — Other Ambulatory Visit: Payer: BC Managed Care – PPO

## 2021-10-13 DIAGNOSIS — K50119 Crohn's disease of large intestine with unspecified complications: Secondary | ICD-10-CM | POA: Diagnosis not present

## 2021-10-15 ENCOUNTER — Ambulatory Visit (HOSPITAL_COMMUNITY)
Admission: RE | Admit: 2021-10-15 | Discharge: 2021-10-15 | Disposition: A | Discharge status: Home / Self Care | Payer: BC Managed Care – PPO | Source: Ambulatory Visit | Attending: Gastroenterology | Admitting: Gastroenterology

## 2021-10-15 ENCOUNTER — Ambulatory Visit (HOSPITAL_COMMUNITY): Payer: BC Managed Care – PPO | Admitting: Anesthesiology

## 2021-10-15 ENCOUNTER — Other Ambulatory Visit: Payer: Self-pay

## 2021-10-15 ENCOUNTER — Encounter (HOSPITAL_COMMUNITY): Payer: Self-pay | Admitting: Gastroenterology

## 2021-10-15 ENCOUNTER — Encounter (HOSPITAL_COMMUNITY)
Admission: RE | Disposition: A | Discharge status: Home / Self Care | Payer: Self-pay | Source: Ambulatory Visit | Attending: Gastroenterology

## 2021-10-15 DIAGNOSIS — K3189 Other diseases of stomach and duodenum: Secondary | ICD-10-CM | POA: Diagnosis not present

## 2021-10-15 DIAGNOSIS — K2950 Unspecified chronic gastritis without bleeding: Secondary | ICD-10-CM | POA: Diagnosis not present

## 2021-10-15 DIAGNOSIS — K2289 Other specified disease of esophagus: Secondary | ICD-10-CM | POA: Insufficient documentation

## 2021-10-15 DIAGNOSIS — K449 Diaphragmatic hernia without obstruction or gangrene: Secondary | ICD-10-CM | POA: Insufficient documentation

## 2021-10-15 DIAGNOSIS — I899 Noninfective disorder of lymphatic vessels and lymph nodes, unspecified: Secondary | ICD-10-CM | POA: Diagnosis not present

## 2021-10-15 DIAGNOSIS — K31A11 Gastric intestinal metaplasia without dysplasia, involving the antrum: Secondary | ICD-10-CM | POA: Insufficient documentation

## 2021-10-15 DIAGNOSIS — I10 Essential (primary) hypertension: Secondary | ICD-10-CM | POA: Diagnosis not present

## 2021-10-15 HISTORY — PX: ESOPHAGOGASTRODUODENOSCOPY: SHX5428

## 2021-10-15 HISTORY — PX: EUS: SHX5427

## 2021-10-15 HISTORY — PX: BIOPSY: SHX5522

## 2021-10-15 SURGERY — UPPER ENDOSCOPIC ULTRASOUND (EUS) RADIAL
Anesthesia: Monitor Anesthesia Care

## 2021-10-15 MED ORDER — LACTATED RINGERS IV SOLN: INTRAVENOUS | Status: DC

## 2021-10-15 MED ORDER — SODIUM CHLORIDE 0.9 % IV SOLN: INTRAVENOUS | Status: DC

## 2021-10-15 MED ORDER — DEXAMETHASONE SODIUM PHOSPHATE 4 MG/ML IJ SOLN
INTRAMUSCULAR | Status: DC | PRN
  Administered 2021-10-15: 4 mg via INTRAVENOUS

## 2021-10-15 MED ORDER — ONDANSETRON HCL 4 MG/2ML IJ SOLN
INTRAMUSCULAR | Status: DC | PRN
  Administered 2021-10-15: 4 mg via INTRAVENOUS

## 2021-10-15 MED ORDER — PROPOFOL 10 MG/ML IV BOLUS
INTRAVENOUS | Status: DC | PRN
  Administered 2021-10-15 (×3): 100 mg via INTRAVENOUS

## 2021-10-15 MED ORDER — PROPOFOL 500 MG/50ML IV EMUL
INTRAVENOUS | Status: DC | PRN
  Administered 2021-10-15: 150 ug/kg/min via INTRAVENOUS

## 2021-10-15 NOTE — Op Note (Signed)
[facility] Patient Name: Christopher Nguyen Procedure Date: 10/15/2021 MRN: [id number] Attending MD: Justice Britain , MD Date of Birth: 1985/11/03 CSN: [id number] Age: 36 Admit Type: Outpatient Procedure:                Upper EUS Indications:              Esophageal mucosal mass/polyp found on endoscopy,                            Submucosal tumor versus extrinsic mass found on                            endoscopy, Follow-up of intestinal metaplasia Providers:                Justice Britain, MD, Burtis Junes, RN, Luan Moore, Technician Referring MD:             Gerrit Heck, MD, Gay Filler. Copland MD, MD Medicines:                Monitored Anesthesia Care Complications:            No immediate complications. Estimated Blood Loss:     Estimated blood loss was minimal. Procedure:                Pre-Anesthesia Assessment:                           - Prior to the procedure, a History and Physical                            was performed, and patient medications and                            allergies were reviewed. The patient's tolerance of                            previous anesthesia was also reviewed. The risks                            and benefits of the procedure and the sedation                            options and risks were discussed with the patient.                            All questions were answered, and informed consent                            was obtained. Prior Anticoagulants: The patient has                            taken no previous anticoagulant or antiplatelet  agents. ASA Grade Assessment: II - A patient with                            mild systemic disease. After reviewing the risks                            and benefits, the patient was deemed in                            satisfactory condition to undergo the procedure.                           After obtaining informed  consent, the endoscope was                            passed under direct vision. Throughout the                            procedure, the patient's blood pressure, pulse, and                            oxygen saturations were monitored continuously. The                            [id number]-[id number] ([id number]) Olympus endoscope was introduced                            through the mouth, and advanced to the second part                            of duodenum. The [id number]-[id number] ([id number]) Olympus                            radial ultrasound scope was introduced through the                            mouth, and advanced to the stomach for ultrasound                            examination. The upper EUS was accomplished without                            difficulty. The patient tolerated the procedure. Scope In: Scope Out: Findings:      ENDOSCOPIC FINDING: :      No gross lesions were noted in the entire esophagus.      The Z-line was irregular and was found 37 cm from the incisors.      A single 7 mm submucosal nodule vs extrinsic impression was found on the       greater curvature of the gastric antrum.      No other gross lesions were noted in the entire examined stomach. Due to       history of intestinal metaplasia, Gastric mapping biopsies were       performed. Biopsies were taken with a  cold forceps for histology from       the cardia/fundus. Biopsies were taken with a cold forceps for histology       from the gastric body (greater curve/lesser curve). Biopsies were taken       with a cold forceps for histology from the incisura and antrum.      No gross lesions were noted in the duodenal bulb, in the first portion       of the duodenum and in the second portion of the duodenum.      ENDOSONOGRAPHIC FINDING: :      There was no sign of significant endosonographic abnormality in the       gallbladder (unremarkable).      The previously noted extrinsic impression vs nodule in the antrum of the        stomach was noted. On endosonographic examination this area showed up as       compression to due to a normal-appearing gallbladder (as described       above).      Endosonographic imaging in the cardia of the stomach, in the body of the       stomach, in the antrum of the stomach and in the pylorus showed no       intramural (subepithelial) lesions.      Endosonographic imaging in the visualized portion of the liver showed no       mass-lesion.      No malignant-appearing lymph nodes were visualized in the paracardial       region (level 16), left gastric region (level 17), gastrohepatic       ligament (level 18), celiac region (level 20) and perigastric region.      The celiac region was visualized. Impression:               EGD Impression:                           - No gross lesions in esophagus. Z-line irregular,                            37 cm from the incisors.                           - A single submucosal nodule vs extrinsic                            impression found in the stomach antrum.                           - No other gross lesions in the stomach. Gastric                            mapping biopsies performed due to history of                            Intestinal Metaplasia.                           - No gross lesions in the duodenal bulb, in the  first portion of the duodenum and in the second                            portion of the duodenum.                           EUS Impression:                           - Extrinsic impression was noted in the antrum of                            the stomach due to a normal-appearing gallbladder                            without overt intramural lesion being noted.                           - No malignant-appearing lymph nodes were                            visualized in the paracardial region (level 16),                            left gastric region (level 17), gastrohepatic                             ligament (level 18), celiac region (level 20) and                            perigastric region. Moderate Sedation:      Not Applicable - Patient had care per Anesthesia. Recommendation:           - The patient will be observed post-procedure,                            until all discharge criteria are met.                           - Discharge patient to home.                           - Patient has a contact number available for                            emergencies. The signs and symptoms of potential                            delayed complications were discussed with the                            patient. Return to normal activities tomorrow.                            Written discharge instructions were provided to the  patient.                           - Observe patient's clinical course.                           - Await path results.                           - Repeat the EGD for surveillance based on                            pathology results (potentially 2-3 years if                            Intestinal metaplasia is found). The noted                            extrinsic impression, will not require additional                            workup at this time unless something else were to                            develop in the future.                           - The findings and recommendations were discussed                            with the patient.                           - The findings and recommendations were discussed                            with the patient's family. Procedure Code(s):        --- Professional ---                           [phone], Esophagogastroduodenoscopy, flexible,                            transoral; with endoscopic ultrasound examination                            limited to the esophagus, stomach or duodenum, and                            adjacent structures                           43239,  Esophagogastroduodenoscopy, flexible,                            transoral; with biopsy, single or multiple Diagnosis Code(s):        --- Professional ---  K22.8, Other specified diseases of esophagus                           K44.9, Diaphragmatic hernia without obstruction or                            gangrene                           K31.89, Other diseases of stomach and duodenum                           I89.9, Noninfective disorder of lymphatic vessels                            and lymph nodes, unspecified                           K92.9, Disease of digestive system, unspecified CPT copyright 2019 American Medical Association. All rights reserved. The codes documented in this report are preliminary and upon coder review may  be revised to meet current compliance requirements. Justice Britain, MD 10/15/2021 9:58:55 AM Number of Addenda: 0

## 2021-10-15 NOTE — Discharge Instructions (Addendum)
YOU HAD AN ENDOSCOPIC PROCEDURE TODAY: Refer to the procedure report and other information in the discharge instructions given to you for any specific questions about what was found during the examination. If this information does not answer your questions, please call [place] office at [phone] to clarify.   YOU SHOULD EXPECT: Some feelings of bloating in the abdomen. Passage of more gas than usual. Walking can help get rid of the air that was put into your GI tract during the procedure and reduce the bloating. If you had a lower endoscopy (such as a colonoscopy or flexible sigmoidoscopy) you may notice spotting of blood in your stool or on the toilet paper. Some abdominal soreness may be present for a day or two, also.  DIET: Your first meal following the procedure should be a light meal and then it is ok to progress to your normal diet. A half-sandwich or bowl of soup is an example of a good first meal. Heavy or fried foods are harder to digest and may make you feel nauseous or bloated. Drink plenty of fluids but you should avoid alcoholic beverages for 24 hours. If you had a esophageal dilation, please see attached instructions for diet.    ACTIVITY: Your care partner should take you home directly after the procedure. You should plan to take it easy, moving slowly for the rest of the day. You can resume normal activity the day after the procedure however YOU SHOULD NOT DRIVE, use power tools, machinery or perform tasks that involve climbing or major physical exertion for 24 hours (because of the sedation medicines used during the test).   SYMPTOMS TO REPORT IMMEDIATELY: A gastroenterologist can be reached at any hour. Please call [phone]  for any of the following symptoms:   Following upper endoscopy (EGD, EUS, ERCP, esophageal dilation) Vomiting of blood or coffee ground material  New, significant abdominal pain  New, significant chest pain or pain under the shoulder blades  Painful or  persistently difficult swallowing  New shortness of breath  Black, tarry-looking or red, bloody stools  FOLLOW UP:  If any biopsies were taken you will be contacted by phone or by letter within the next 1-3 weeks. Call [phone]  if you have not heard about the biopsies in 3 weeks.  Please also call with any specific questions about appointments or follow up tests.  

## 2021-10-15 NOTE — Anesthesia Preprocedure Evaluation (Signed)
Anesthesia Evaluation  Patient identified by MRN, date of birth, ID band Patient awake    Reviewed: Allergy & Precautions, NPO status , Patient's Chart, lab work & pertinent test results  Airway Mallampati: II  TM Distance: >3 FB Neck ROM: Full    Dental   Pulmonary neg pulmonary ROS,    breath sounds clear to auscultation       Cardiovascular hypertension, Pt. on medications  Rhythm:Regular Rate:Normal     Neuro/Psych negative neurological ROS     GI/Hepatic Neg liver ROS, crohns dz   Endo/Other  negative endocrine ROS  Renal/GU negative Renal ROS     Musculoskeletal   Abdominal   Peds  Hematology negative hematology ROS (+)   Anesthesia Other Findings   Reproductive/Obstetrics                             Anesthesia Physical Anesthesia Plan  ASA: 2  Anesthesia Plan: MAC   Post-op Pain Management: Minimal or no pain anticipated   Induction:   PONV Risk Score and Plan: 1 and Propofol infusion and Ondansetron  Airway Management Planned: Natural Airway and Nasal Cannula  Additional Equipment:   Intra-op Plan:   Post-operative Plan:   Informed Consent: I have reviewed the patients History and Physical, chart, labs and discussed the procedure including the risks, benefits and alternatives for the proposed anesthesia with the patient or authorized representative who has indicated his/her understanding and acceptance.       Plan Discussed with:   Anesthesia Plan Comments:         Anesthesia Quick Evaluation

## 2021-10-15 NOTE — Transfer of Care (Signed)
Immediate Anesthesia Transfer of Care Note  Patient: EATON FOLMAR  Procedure(s) Performed: UPPER ENDOSCOPIC ULTRASOUND (EUS) RADIAL ESOPHAGOGASTRODUODENOSCOPY (EGD) BIOPSY  Patient Location: PACU  Anesthesia Type:MAC  Level of Consciousness: drowsy  Airway & Oxygen Therapy: Patient Spontanous Breathing and Patient connected to face mask oxygen  Post-op Assessment: Report given to RN, Post -op Vital signs reviewed and stable and Patient moving all extremities X 4  Post vital signs: Reviewed and stable  Last Vitals:  Vitals Value Taken Time  BP 111/56 10/15/21 0950  Temp    Pulse 69 10/15/21 0953  Resp 15 10/15/21 0953  SpO2 99 % 10/15/21 0953  Vitals shown include unvalidated device data.  Last Pain:  Vitals:   10/15/21 0949  TempSrc: Temporal  PainSc: Asleep         Complications: No notable events documented.

## 2021-10-15 NOTE — H&P (Signed)
GASTROENTEROLOGY PROCEDURE H&P NOTE   Primary Care Physician: [name], MD  HPI: Christopher Nguyen is a 36 y.o. male who presents for Upper EUS/EGD for evaluation of an antral SEL as well as Intestinal metaplasia and needs for gastric mapping.  Past Medical History:  Diagnosis Date   Azoospermia due to obstruction    Crohn disease ([place])    Hypertension    Low testosterone    Past Surgical History:  Procedure Laterality Date   CLUB FOOT RELEASE  infant   COLONOSCOPY     TESTICLE BIOPSY Bilateral 01/28/2015   Procedure: BIOPSY TESTICULAR;  Surgeon: Carolan Clines, MD;  Location: [facility];  Service: Urology;  Laterality: Bilateral;   UPPER GASTROINTESTINAL ENDOSCOPY     Current Facility-Administered Medications  Medication Dose Route Frequency Provider Last Rate Last Admin   0.9 %  sodium chloride infusion   Intravenous Continuous Mansouraty, Telford Nab., MD       lactated ringers infusion   Intravenous Continuous Mansouraty, Telford Nab., MD        Current Facility-Administered Medications:    0.9 %  sodium chloride infusion, , Intravenous, Continuous, Mansouraty, Telford Nab., MD   lactated ringers infusion, , Intravenous, Continuous, Mansouraty, Telford Nab., MD Allergies  Allergen Reactions   Bentyl [Dicyclomine] Swelling   Cephalosporins Other (See Comments)    Unknown childhood reaction   Family History  Problem Relation Age of Onset   Bladder Cancer Mother    Colon cancer Father    Hypertension Father    Hypertension Brother    Melanoma Brother    Alcoholism Maternal Grandmother    Alcohol abuse Maternal Grandfather    Alcoholism Maternal Grandfather    Esophageal cancer Neg Hx    Rectal cancer Neg Hx    Stomach cancer Neg Hx    Social History   Socioeconomic History   Marital status: Married    Spouse name: Not on file   Number of children: 2   Years of education: Not on file   Highest education level: Not on file   Occupational History   Occupation: Chiropodist  Tobacco Use   Smoking status: Never   Smokeless tobacco: Never  Vaping Use   Vaping Use: Never used  Substance and Sexual Activity   Alcohol use: Yes    Comment: weekly   Drug use: No   Sexual activity: Not on file  Other Topics Concern   Not on file  Social History Narrative   Not on file   Social Determinants of Health   Financial Resource Strain: Not on file  Food Insecurity: Not on file  Transportation Needs: Not on file  Physical Activity: Not on file  Stress: Not on file  Social Connections: Not on file  Intimate Partner Violence: Not on file    Physical Exam: Today's Vitals   10/15/21 0823  BP: (!) 176/96  Pulse: (!) 59  Resp: 13  Temp: 98 F (36.7 C)  TempSrc: Temporal  SpO2: 100%  Weight: 71.7 kg  Height: 5' 3"  (1.6 m)  PainSc: 0-No pain   Body mass index is 27.99 kg/m. GEN: NAD EYE: Sclerae anicteric ENT: MMM CV: Non-tachycardic GI: Soft, NT/ND NEURO:  Alert & Oriented x 3  Lab Results: No results for input(s): "WBC", "HGB", "HCT", "PLT" in the last 72 hours. BMET No results for input(s): "NA", "K", "CL", "CO2", "GLUCOSE", "BUN", "CREATININE", "CALCIUM" in the last 72 hours. LFT No results for input(s): "PROT", "ALBUMIN", "AST", "  ALT", "ALKPHOS", "BILITOT", "BILIDIR", "IBILI" in the last 72 hours. PT/INR No results for input(s): "LABPROT", "INR" in the last 72 hours.   Impression / Plan: This is a 36 y.o.male who presents for Upper EUS/EGD for evaluation of an antral SEL as well as Intestinal metaplasia and needs for gastric mapping.  The risks of an EUS including intestinal perforation, bleeding, infection, aspiration, and medication effects were discussed as was the possibility it may not give a definitive diagnosis if a biopsy is performed.  When a biopsy of the pancreas is done as part of the EUS, there is an additional risk of pancreatitis at the rate of about 1-2%.  It was  explained that procedure related pancreatitis is typically mild, although it can be severe and even life threatening, which is why we do not perform random pancreatic biopsies and only biopsy a lesion/area we feel is concerning enough to warrant the risk.  The risks and benefits of endoscopic evaluation/treatment were discussed with the patient and/or family; these include but are not limited to the risk of perforation, infection, bleeding, missed lesions, lack of diagnosis, severe illness requiring hospitalization, as well as anesthesia and sedation related illnesses.  The patient's history has been reviewed, patient examined, no change in status, and deemed stable for procedure.  The patient and/or family is agreeable to proceed.    Justice Britain, MD [facility] Office # [phone]

## 2021-10-16 ENCOUNTER — Encounter: Payer: Self-pay | Admitting: Gastroenterology

## 2021-10-16 LAB — GI PROFILE, STOOL, PCR

## 2021-10-16 LAB — SURGICAL PATHOLOGY

## 2021-10-16 LAB — CALPROTECTIN, FECAL: Calprotectin, Fecal: 14 ug/g (ref 0–120)

## 2021-10-16 NOTE — Anesthesia Postprocedure Evaluation (Signed)
Anesthesia Post Note  Patient: CHANCEY RINGEL  Procedure(s) Performed: UPPER ENDOSCOPIC ULTRASOUND (EUS) RADIAL ESOPHAGOGASTRODUODENOSCOPY (EGD) BIOPSY     Patient location during evaluation: PACU Anesthesia Type: MAC Level of consciousness: awake and alert Pain management: pain level controlled Vital Signs Assessment: post-procedure vital signs reviewed and stable Respiratory status: spontaneous breathing, nonlabored ventilation, respiratory function stable and patient connected to nasal cannula oxygen Cardiovascular status: stable and blood pressure returned to baseline Postop Assessment: no apparent nausea or vomiting Anesthetic complications: no   No notable events documented.  Last Vitals:  Vitals:   10/15/21 1010 10/15/21 1020  BP: 124/81 (!) 145/88  Pulse: 65 64  Resp: 14 16  Temp:    SpO2: 100% 100%    Last Pain:  Vitals:   10/15/21 1020  TempSrc:   PainSc: 0-No pain                 Tiajuana Amass

## 2021-10-18 ENCOUNTER — Encounter (HOSPITAL_COMMUNITY): Payer: Self-pay | Admitting: Gastroenterology

## 2021-12-01 ENCOUNTER — Other Ambulatory Visit: Payer: Self-pay | Admitting: Family Medicine

## 2021-12-01 DIAGNOSIS — I10 Essential (primary) hypertension: Secondary | ICD-10-CM

## 2021-12-04 ENCOUNTER — Encounter: Payer: Self-pay | Admitting: Gastroenterology

## 2022-01-19 DIAGNOSIS — E291 Testicular hypofunction: Secondary | ICD-10-CM | POA: Diagnosis not present

## 2022-01-26 DIAGNOSIS — E291 Testicular hypofunction: Secondary | ICD-10-CM | POA: Diagnosis not present

## 2022-04-19 ENCOUNTER — Other Ambulatory Visit: Payer: Self-pay | Admitting: Gastroenterology

## 2022-04-24 ENCOUNTER — Telehealth: Payer: Self-pay | Admitting: Physician Assistant

## 2022-04-24 NOTE — Telephone Encounter (Signed)
Received MyChart message from patient who states he has been having symptoms of a flare with abdominal pain which has continued for a week or more, fullness, bloating, diarrhea, and irregular stools.  He is seeking advice.  First appointment with Anderson Malta is 4/18.

## 2022-04-24 NOTE — Telephone Encounter (Signed)
Pt scheduled to see Nicoletta Ba PA 04/27/22 at 1:30pm. Please let pt know about appt.

## 2022-04-26 ENCOUNTER — Emergency Department (HOSPITAL_COMMUNITY): Payer: BC Managed Care – PPO

## 2022-04-26 ENCOUNTER — Emergency Department (HOSPITAL_COMMUNITY)
Admission: EM | Admit: 2022-04-26 | Discharge: 2022-04-26 | Disposition: A | Discharge status: Home / Self Care | Payer: BC Managed Care – PPO | Attending: Emergency Medicine | Admitting: Emergency Medicine

## 2022-04-26 ENCOUNTER — Encounter (HOSPITAL_COMMUNITY): Payer: Self-pay

## 2022-04-26 ENCOUNTER — Other Ambulatory Visit: Payer: Self-pay

## 2022-04-26 DIAGNOSIS — Z79899 Other long term (current) drug therapy: Secondary | ICD-10-CM | POA: Diagnosis not present

## 2022-04-26 DIAGNOSIS — R109 Unspecified abdominal pain: Secondary | ICD-10-CM | POA: Diagnosis not present

## 2022-04-26 DIAGNOSIS — D72829 Elevated white blood cell count, unspecified: Secondary | ICD-10-CM | POA: Diagnosis not present

## 2022-04-26 DIAGNOSIS — K5090 Crohn's disease, unspecified, without complications: Secondary | ICD-10-CM | POA: Diagnosis not present

## 2022-04-26 DIAGNOSIS — K5010 Crohn's disease of large intestine without complications: Secondary | ICD-10-CM | POA: Diagnosis not present

## 2022-04-26 DIAGNOSIS — K6389 Other specified diseases of intestine: Secondary | ICD-10-CM | POA: Diagnosis not present

## 2022-04-26 DIAGNOSIS — K529 Noninfective gastroenteritis and colitis, unspecified: Secondary | ICD-10-CM | POA: Diagnosis not present

## 2022-04-26 DIAGNOSIS — I10 Essential (primary) hypertension: Secondary | ICD-10-CM | POA: Insufficient documentation

## 2022-04-26 DIAGNOSIS — K50119 Crohn's disease of large intestine with unspecified complications: Secondary | ICD-10-CM

## 2022-04-26 LAB — CBC
HCT: 52.3 % — ABNORMAL HIGH (ref 39.0–52.0)
Hemoglobin: 17.6 g/dL — ABNORMAL HIGH (ref 13.0–17.0)
MCH: 28.3 pg (ref 26.0–34.0)
MCHC: 33.7 g/dL (ref 30.0–36.0)
MCV: 84.2 fL (ref 80.0–100.0)
Platelets: 316 10*3/uL (ref 150–400)
RBC: 6.21 MIL/uL — ABNORMAL HIGH (ref 4.22–5.81)
RDW: 13.7 % (ref 11.5–15.5)
WBC: 14.1 10*3/uL — ABNORMAL HIGH (ref 4.0–10.5)
nRBC: 0 % (ref 0.0–0.2)

## 2022-04-26 LAB — COMPREHENSIVE METABOLIC PANEL
ALT: 24 U/L (ref 0–44)
AST: 29 U/L (ref 15–41)
Albumin: 5 g/dL (ref 3.5–5.0)
Alkaline Phosphatase: 64 U/L (ref 38–126)
Anion gap: 13 (ref 5–15)
BUN: 20 mg/dL (ref 6–20)
CO2: 23 mmol/L (ref 22–32)
Calcium: 9.3 mg/dL (ref 8.9–10.3)
Chloride: 102 mmol/L (ref 98–111)
Creatinine, Ser: 1.22 mg/dL (ref 0.61–1.24)
GFR, Estimated: >60 mL/min (ref >60)
Glucose, Bld: 116 mg/dL — ABNORMAL HIGH (ref 70–99)
Potassium: 4.2 mmol/L (ref 3.5–5.1)
Sodium: 138 mmol/L (ref 135–145)
Total Bilirubin: 0.8 mg/dL (ref 0.3–1.2)
Total Protein: 8.6 g/dL — ABNORMAL HIGH (ref 6.5–8.1)

## 2022-04-26 LAB — URINALYSIS, ROUTINE W REFLEX MICROSCOPIC
Bilirubin Urine: NEGATIVE
Glucose, UA: NEGATIVE mg/dL
Hgb urine dipstick: NEGATIVE
Ketones, ur: 5 mg/dL — AB
Leukocytes,Ua: NEGATIVE
Nitrite: NEGATIVE
Protein, ur: 30 mg/dL — AB
Specific Gravity, Urine: 1.026 (ref 1.005–1.030)
pH: 6 (ref 5.0–8.0)

## 2022-04-26 LAB — LIPASE, BLOOD: Lipase: 37 U/L (ref 11–51)

## 2022-04-26 MED ORDER — PREDNISONE 20 MG PO TABS
40.0000 mg | ORAL_TABLET | Freq: Once | ORAL | Status: AC
  Administered 2022-04-26: 40 mg via ORAL
  Filled 2022-04-26: qty 2

## 2022-04-26 MED ORDER — IOHEXOL 300 MG/ML  SOLN
100.0000 mL | Freq: Once | INTRAMUSCULAR | Status: AC | PRN
  Administered 2022-04-26: 100 mL via INTRAVENOUS

## 2022-04-26 MED ORDER — ONDANSETRON HCL 4 MG/2ML IJ SOLN
4.0000 mg | Freq: Once | INTRAMUSCULAR | Status: AC
  Administered 2022-04-26: 4 mg via INTRAVENOUS
  Filled 2022-04-26: qty 2

## 2022-04-26 MED ORDER — ONDANSETRON HCL 4 MG PO TABS: 4.0000 mg | ORAL_TABLET | Freq: Four times a day (QID) | ORAL | 0 refills | Status: AC

## 2022-04-26 MED ORDER — PREDNISONE 20 MG PO TABS: 40.0000 mg | ORAL_TABLET | Freq: Every day | ORAL | 0 refills | Status: DC

## 2022-04-26 MED ORDER — LACTATED RINGERS IV BOLUS
1000.0000 mL | Freq: Once | INTRAVENOUS | Status: AC
  Administered 2022-04-26: 1000 mL via INTRAVENOUS

## 2022-04-26 MED ORDER — MORPHINE SULFATE (PF) 4 MG/ML IV SOLN
4.0000 mg | Freq: Once | INTRAVENOUS | Status: AC
  Administered 2022-04-26: 4 mg via INTRAVENOUS
  Filled 2022-04-26: qty 1

## 2022-04-26 NOTE — Discharge Instructions (Addendum)
Take the course of steroids as I prescribed, and follow-up with your GI office regarding taper from thereon.  Return to the emergency department if you have nausea, vomiting that cannot be controlled with nausea medication, significant fever, chills, or severe worsening abdominal pain despite treatment.

## 2022-04-26 NOTE — ED Triage Notes (Signed)
Pt reports diarrhea, abd pain and vomiting. Hx Crohn's

## 2022-04-26 NOTE — ED Provider Notes (Signed)
[facility] Provider Note   CSN: [id number] Arrival date & time: 04/26/22  1722     History  Chief Complaint  Patient presents with   Emesis    Christopher Nguyen is a 37 y.o. male with past medical history significant for Crohn's disease, hypertension who presents with concern for diarrhea, abdominal pain, vomiting, intermittent constipation, he reports that he has not had any gas passage since Friday.  He is concerned for possible intestinal blockage, versus Crohn's flare.  He reports abdominal pain 7/10, mostly epigastric, sharp in nature.  He endorses significant nausea, vomiting since last night.  He takes mesalamine, and has had steroids in the past for Crohn's flare.  He reports that he could not get an appointment with his GI doctor until tomorrow.  He denies any fever, chills, sore throat, congestion, chest pain, shortness of breath.  He denies any dysuria, hematuria, urinary frequency.   Emesis      Home Medications Prior to Admission medications   Medication Sig Start Date End Date Taking? Authorizing Provider  ondansetron (ZOFRAN) 4 MG tablet Take 1 tablet (4 mg total) by mouth every 6 (six) hours. 04/26/22  Yes Silvano Garofano H, PA-C  predniSONE (DELTASONE) 20 MG tablet Take 2 tablets (40 mg total) by mouth daily. 04/26/22  Yes Malvina Schadler H, PA-C  amLODipine (NORVASC) 5 MG tablet TAKE ONE TABLET BY MOUTH ONE TIME DAILY 12/01/21   Copland, Gay Filler, MD  FLUoxetine (PROZAC) 20 MG capsule Take 1 capsule (20 mg total) by mouth daily. 05/14/21   Copland, Gay Filler, MD  Mesalamine 800 MG TBEC Take 800 mg by mouth in the morning, at noon, and at bedtime.    [provider]  Multiple Vitamin (MULTIVITAMIN) tablet Take 1 tablet by mouth daily.    [provider]  testosterone cypionate (DEPOTESTOSTERONE CYPIONATE) 200 MG/ML injection Inject 100 mg into the muscle every Sunday. 08/06/19   [provider]       Allergies    Bentyl [dicyclomine] and Cephalosporins    Review of Systems   Review of Systems  Gastrointestinal:  Positive for vomiting.  All other systems reviewed and are negative.   Physical Exam Updated Vital Signs BP (!) 110/99 (BP Location: Left Arm)   Pulse 84   Temp 98.3 F (36.8 C) (Oral)   Resp 20   Ht '5\' 3"'$  (1.6 m)   Wt 74.8 kg   SpO2 99%   BMI 29.23 kg/m  Physical Exam Vitals and nursing note reviewed.  Constitutional:      General: He is not in acute distress.    Appearance: Normal appearance.  HENT:     Head: Normocephalic and atraumatic.  Eyes:     General:        Right eye: No discharge.        Left eye: No discharge.  Cardiovascular:     Rate and Rhythm: Normal rate and regular rhythm.     Heart sounds: No murmur heard.    No friction rub. No gallop.  Pulmonary:     Effort: Pulmonary effort is normal.     Breath sounds: Normal breath sounds.  Abdominal:     General: Bowel sounds are normal.     Palpations: Abdomen is soft.     Comments: Focal tenderness to palpation in the epigastric region without rebound, rigidity, guarding.  No distention noted.  Normal bowel sounds throughout.  Skin:    General: Skin  is warm and dry.     Capillary Refill: Capillary refill takes less than 2 seconds.  Neurological:     Mental Status: He is alert and oriented to person, place, and time.  Psychiatric:        Mood and Affect: Mood normal.        Behavior: Behavior normal.     ED Results / Procedures / Treatments   Labs (all labs ordered are listed, but only abnormal results are displayed) Labs Reviewed  COMPREHENSIVE METABOLIC PANEL - Abnormal; Notable for the following components:      Result Value   Glucose, Bld 116 (*)    Total Protein 8.6 (*)    All other components within normal limits  CBC - Abnormal; Notable for the following components:   WBC 14.1 (*)    RBC 6.21 (*)    Hemoglobin 17.6 (*)    HCT 52.3 (*)    All other components  within normal limits  URINALYSIS, ROUTINE W REFLEX MICROSCOPIC - Abnormal; Notable for the following components:   APPearance HAZY (*)    Ketones, ur 5 (*)    Protein, ur 30 (*)    Bacteria, UA RARE (*)    All other components within normal limits  LIPASE, BLOOD    EKG None  Radiology CT ABDOMEN PELVIS W CONTRAST  Result Date: 04/26/2022 CLINICAL DATA:  Patient with history of Crohn's disease with abdominal pain, vomiting, and diarrhea EXAM: CT ABDOMEN AND PELVIS WITH CONTRAST TECHNIQUE: Multidetector CT imaging of the abdomen and pelvis was performed using the standard protocol following bolus administration of intravenous contrast. RADIATION DOSE REDUCTION: This exam was performed according to the departmental dose-optimization program which includes automated exposure control, adjustment of the mA and/or kV according to patient size and/or use of iterative reconstruction technique. CONTRAST:  195m OMNIPAQUE IOHEXOL 300 MG/ML  SOLN COMPARISON:  None Available. FINDINGS: Lower chest: No focal consolidation or pulmonary nodule in the lung bases. No pleural effusion or pneumothorax demonstrated. Partially imaged heart size is normal. Hepatobiliary: No focal hepatic lesions. No intra or extrahepatic biliary ductal dilation. Normal gallbladder. Pancreas: No focal lesions or main ductal dilation. Spleen: Normal in size without focal abnormality. Adrenals/Urinary Tract: No adrenal nodules. No suspicious renal mass, calculi or hydronephrosis. No focal bladder wall thickening. Stomach/Bowel: Normal appearance of the stomach. Long segment mural thickening and mucosal hyperenhancement of mid to distal small bowel loops. Short-segment mucosal hyperenhancement of the proximal transverse colon and redundant sigmoid colon. Short segment mucosal hyperenhancement of the terminal ileum. No fistula or abscess. Normal appendix. Vascular/Lymphatic: No significant vascular findings are present. No enlarged abdominal or  pelvic lymph nodes. Reproductive: Prostate is unremarkable. Other: No free fluid, fluid collection, or free air. Musculoskeletal: No acute or abnormal lytic or blastic osseous lesions. IMPRESSION: Long segment mural thickening and mucosal hyperenhancement of mid to distal small bowel loops with short-segment mucosal hyperenhancement of the terminal ileum, proximal transverse colon, and redundant sigmoid colon, consistent with active Crohn's disease. No fistula or abscess. Electronically Signed   By: LDarrin NipperM.D.   On: 04/26/2022 19:35    Procedures Procedures    Medications Ordered in ED Medications  predniSONE (DELTASONE) tablet 40 mg (has no administration in time range)  morphine (PF) 4 MG/ML injection 4 mg (4 mg Intravenous Given 04/26/22 1752)  ondansetron (ZOFRAN) injection 4 mg (4 mg Intravenous Given 04/26/22 1753)  lactated ringers bolus 1,000 mL (1,000 mLs Intravenous New Bag/Given 04/26/22 1753)  iohexol (OMNIPAQUE) 300  MG/ML solution 100 mL (100 mLs Intravenous Contrast Given 04/26/22 1858)    ED Course/ Medical Decision Making/ A&P                             Medical Decision Making Amount and/or Complexity of Data Reviewed Labs: ordered. Radiology: ordered.  Risk Prescription drug management.   This patient is a 37 y.o. male  who presents to the ED for concern of epigastric pain, nausea, vomiting, constipation / diarrhea intermittently.   Differential diagnoses prior to evaluation: The emergent differential diagnosis includes, but is not limited to,  esophagitis, gastritis, peptic ulcer disease, esophageal rupture, gastric rupture, Boerhaave's, Mallory-Weiss, pancreatitis, cholecystitis, cholangitis, acute mesenteric ischemia, atypical chest pain or ACS, lower lobar pneumonia versus crohns flare vs other . This is not an exhaustive differential.   Past Medical History / Co-morbidities: Crohns disease, no previous intra-abdominal surgery  Additional history: Chart  reviewed. Pertinent results include: reviewed previous endoscopies, outpatient GI notes  Physical Exam: Physical exam performed. The pertinent findings include: generalized abdominal TTP, most focally in epigastric region  Lab Tests/Imaging studies: I personally interpreted labs/imaging and the pertinent results include: CBC notable for moderate leukocytosis, white blood cells 14.1, with hemoglobin of 17.68 suspect some degree of hemoconcentration, especially with urine showing ketones, protein.  CMP is overall unremarkable, lipase negative . CT abdomen pelvis shows mural thickening throughout GI system suggestive of acute Crohn's flareI agree with the radiologist interpretation.   Medications: I ordered medication including fluid bolus, Zofran, morphine, prednisone, will discharge with 40 mg prednisone for 7 days, with GI to set taper, will discharge with nausea medication.  I have reviewed the patients home medicines and have made adjustments as needed.  I spoke with Dr. Hilarie Fredrickson with GI who given recommendations for length of prednisone treatments and will message Dr. Bryan Lemma schedule patient for follow-up   Disposition: After consideration of the diagnostic results and the patients response to treatment, I feel that patient is stable for discharge at this time, tolerating PO, and pain improved at time of discharge .   emergency department workup does not suggest an emergent condition requiring admission or immediate intervention beyond what has been performed at this time. The plan is: as above. The patient is safe for discharge and has been instructed to return immediately for worsening symptoms, change in symptoms or any other concerns.  Final Clinical Impression(s) / ED Diagnoses Final diagnoses:  Crohn's colitis, unspecified complication ([place])    Rx / DC Orders ED Discharge Orders          Ordered    predniSONE (DELTASONE) 20 MG tablet  Daily        04/26/22 2016    ondansetron  (ZOFRAN) 4 MG tablet  Every 6 hours        04/26/22 2016              Dorien Chihuahua 04/26/22 2026    Dorie Rank, MD 04/29/22 [phone]

## 2022-04-27 ENCOUNTER — Encounter: Payer: Self-pay | Admitting: Physician Assistant

## 2022-04-27 ENCOUNTER — Ambulatory Visit: Payer: BC Managed Care – PPO | Admitting: Physician Assistant

## 2022-04-27 ENCOUNTER — Other Ambulatory Visit (INDEPENDENT_AMBULATORY_CARE_PROVIDER_SITE_OTHER): Payer: BC Managed Care – PPO

## 2022-04-27 VITALS — BP 138/89 | HR 81 | Ht 63.0 in | Wt 169.5 lb

## 2022-04-27 DIAGNOSIS — K5090 Crohn's disease, unspecified, without complications: Secondary | ICD-10-CM | POA: Diagnosis not present

## 2022-04-27 DIAGNOSIS — K529 Noninfective gastroenteritis and colitis, unspecified: Secondary | ICD-10-CM | POA: Diagnosis not present

## 2022-04-27 DIAGNOSIS — Z79899 Other long term (current) drug therapy: Secondary | ICD-10-CM | POA: Diagnosis not present

## 2022-04-27 LAB — C-REACTIVE PROTEIN: CRP: 5.8 mg/dL (ref 0.5–20.0)

## 2022-04-27 LAB — SEDIMENTATION RATE: Sed Rate: 18 mm/hr — ABNORMAL HIGH (ref 0–15)

## 2022-04-27 MED ORDER — PREDNISONE 10 MG PO TABS: ORAL_TABLET | ORAL | 0 refills | Status: AC

## 2022-04-27 NOTE — Patient Instructions (Addendum)
_______________________________________________________  If your blood pressure at your visit was 140/90 or greater, please contact your primary care physician to follow up on this.  _______________________________________________________  If you are age 37 or older, your body mass index should be between 23-30. Your Body mass index is 30.03 kg/m. If this is out of the aforementioned range listed, please consider follow up with your Primary Care Provider.  If you are age 34 or younger, your body mass index should be between 19-25. Your Body mass index is 30.03 kg/m. If this is out of the aformentioned range listed, please consider follow up with your Primary Care Provider.   ________________________________________________________  The [facility] providers would like to encourage you to use [facility] to communicate with providers for non-urgent requests or questions.  Due to long hold times on the telephone, sending your provider a message by [facility] may be a faster and more efficient way to get a response.  Please allow 48 business hours for a response.  Please remember that this is for non-urgent requests.  _______________________________________________________  Your provider has requested that you go to the basement level for lab work before leaving today. Press "B" on the elevator. The lab is located at the first door on the left as you exit the elevator.  Continue Asacol 800 mg three times daily  We have sent the following medications to your pharmacy for you to pick up at your convenience: Prednisone - Taper as directed

## 2022-04-27 NOTE — Progress Notes (Signed)
Agree with the assessment and plan as outlined by Nicoletta Ba, PA-C.  Agree the clinical presentation and radiographic findings all concerning for active Crohn's flare with ileal involvement, which will require escalation of therapy.  Agree with plan for prednisone with taper as outlined, labs to check degree of active inflammation now, along with labs to set up for escalating to biologic therapy.  Agree with initiating for insurance approval now for starting biologic therapy with either Remicade, Entyvio, or Humira upfront.  Arnaldo Heffron, DO, [facility]

## 2022-04-27 NOTE — Progress Notes (Signed)
Subjective:    Patient ID: Christopher Nguyen, male    DOB: 21-Dec-1985, 37 y.o.   MRN: [id number]  HPI Savonte is a pleasant 37 year old white male, established with Dr. Bryan Lemma.  He was diagnosed with Crohn's colitis in February 2022.  He comes in today with 3-week history of significant increase in abdominal pain, frequent loose stools, some ribboning thin stools, then onset of nausea and vomiting this past weekend which precipitated a visit to the emergency room.    He had undergone colonoscopy at that time which showed inflammation, edema erythema and aphthous ulcerations in a continuous and circumferential pattern from the rectum to the cecum, terminal ileum appeared normal as did the rectum.  He also had EGD at that same time.  Biopsies from the small bowel showed increased epithelial lymphocytes which was nonspecific, no villous blunting or other evidence of celiac disease.  No H. pylori, there was a gastric nodule biopsied which showed inflammation but no dysplasia. Path from the colon showed a chronic active colitis consistent with inflammatory bowel disease.-He was initially treated with Asacol 800 mg 3 times daily which she is still taking. Initial fecal calprotectin 915. He did have some gallbladder workup in around that same time and had an abnormal HIDA scan with EF of 8%.  Ultrasound was negative. He had repeat colonoscopy in February 2023, as well as EGD.  Again small bowel biopsies were unremarkable no evidence of celiac disease and the gastric nodule again showed inflammation and focal intestinal metaplasia.  Biopsies from the colon showed chronic active colitis in the ascending colon, biopsies from the remainder of the colon were otherwise unremarkable and he was continued on the same therapy with Asacol  Says he had done well until about 3 weeks ago when he developed abdominal pain discomfort cramping increased frequency of stools which have been very loose, also with some narrow  ribboning type stools, no rectal bleeding, no fever or chills.  Saturday and Sunday he had nausea and vomiting and vomited in the ER for evaluation.  He did have CT of the abdomen and pelvis which showed a long segment of mural thickening and mucosal hyperenhancement of the mid to distal small bowel loops with short segment mucosal hyperenhancement of the terminal ileum and proximal transverse colon and redundant sigmoid colon all consistent with active Crohn's disease, no fistula or abscess noted.  Labs at that time showed WBC of 14.1/hemoglobin 17.6. GI doc on-call advised starting prednisone 40 mg daily which she has been taking over the past 2 days. He feels a little bit better at this point not having as much abdominal pain or discomfort continues to have diarrhea.  He is taking mostly liquids as he has been afraid to aggravate the diarrhea, no further vomiting.  He was intolerant to dicyclomine in the past with rash. Says he has done okay with steroids in the past though does get a bit hyperactive.  Review of Systems Pertinent positive and negative review of systems were noted in the above HPI section.  All other review of systems was otherwise negative.   Outpatient Encounter Medications as of 04/27/2022  Medication Sig   amLODipine (NORVASC) 5 MG tablet TAKE ONE TABLET BY MOUTH ONE TIME DAILY   FLUoxetine (PROZAC) 20 MG capsule Take 1 capsule (20 mg total) by mouth daily.   Mesalamine 800 MG TBEC Take 800 mg by mouth in the morning, at noon, and at bedtime.   Multiple Vitamin (MULTIVITAMIN) tablet Take 1 tablet  by mouth daily.   ondansetron (ZOFRAN) 4 MG tablet Take 1 tablet (4 mg total) by mouth every 6 (six) hours.   predniSONE (DELTASONE) 10 MG tablet Take 4 tablets (40 mg total) by mouth daily with breakfast for 7 days, THEN 3 tablets (30 mg total) daily with breakfast for 7 days, THEN 2 tablets (20 mg total) daily with breakfast for 7 days, THEN 1 tablet (10 mg total) daily with breakfast  for 7 days.   testosterone cypionate (DEPOTESTOSTERONE CYPIONATE) 200 MG/ML injection Inject 100 mg into the muscle every Sunday.   [DISCONTINUED] predniSONE (DELTASONE) 20 MG tablet Take 2 tablets (40 mg total) by mouth daily.   No facility-administered encounter medications on file as of 04/27/2022.   Allergies  Allergen Reactions   Bentyl [Dicyclomine] Swelling   Cephalosporins Other (See Comments)    Unknown childhood reaction   Patient Active Problem List   Diagnosis Date Noted   Crohn's disease ([place]) 11/09/2020   Overweight 09/18/2015   Essential hypertension, benign 09/18/2015   Social History   Socioeconomic History   Marital status: Married    Spouse name: Not on file   Number of children: 2   Years of education: Not on file   Highest education level: Not on file  Occupational History   Occupation: Chiropodist  Tobacco Use   Smoking status: Never   Smokeless tobacco: Never  Vaping Use   Vaping Use: Never used  Substance and Sexual Activity   Alcohol use: Yes    Comment: weekly   Drug use: No   Sexual activity: Not on file  Other Topics Concern   Not on file  Social History Narrative   Not on file   Social Determinants of Health   Financial Resource Strain: Not on file  Food Insecurity: Not on file  Transportation Needs: Not on file  Physical Activity: Not on file  Stress: Not on file  Social Connections: Not on file  Intimate Partner Violence: Not on file    Mr. Dominik's family history includes Alcohol abuse in his maternal grandfather; Alcoholism in his maternal grandfather and maternal grandmother; Bladder Cancer in his mother; Colon cancer in his father; Hypertension in his brother and father; Melanoma in his brother.      Objective:    Vitals:   04/27/22 1331  BP: 138/89  Pulse: 81  SpO2: 96%    Physical Exam.Well-developed well-nourished white male in no acute distress.  Height, Weight, 169 BMI 30.03  HEENT; nontraumatic  normocephalic, EOMI, PE R LA, sclera anicteric. Oropharynx; not examined today Neck; supple, no JVD Cardiovascular; regular rate and rhythm with S1-S2, no murmur rub or gallop Pulmonary; Clear bilaterally Abdomen; soft,  nondistended, there is some tenderness in the lower abdomen right mid quadrant and mid abdomen guarding or rebound no palpable mass or hepatosplenomegaly, bowel sounds are active Rectal; not done today Skin; benign exam, no jaundice rash or appreciable lesions Extremities; no clubbing cyanosis or edema skin warm and dry Neuro/Psych; alert and oriented x4, grossly nonfocal mood and affect appropriate        Assessment & Plan:   #51 37 year old white male with history of Crohn's colitis initially diagnosed in March 2022, normal-appearing terminal ileum at colonoscopy at that time.  He has been maintained on Asacol 800 mg 3 times daily. Now with 3 to 4-week history of increased abdominal pain cramping increased frequency of loose stools, then nausea and vomiting.  ER visit this past weekend with CT abdomen and  pelvis as above showing a long segment of mural thickening and mucosal hyperenhancement in the mid to distal small bowel loops and then shorter segment hyperenhancement of the TI proximal transverse colon and redundant sigmoid.  Symptoms are consistent with exacerbation of Crohn's disease now confirming involvement of the more distal small bowel  No evidence for abscess fistulization or obstruction on CT  #2 history of hypertension #3 history of gastritis and previous gastric nodule with biopsy showing intestinal metaplasia, EUS August 2023 showed a single submucosal nodule in the gastric antrum, gastric mapping biopsies were done-biopsy showed focal intestinal metaplasia slight chronic inflammation negative for dysplasia or malignancy and gastric mapping negative for intestinal metaplasia or dysplasia.  Plan; will plan to continue prednisone 40 mg p.o. daily for total  of 2 weeks, then decrease to 30 mg daily x 1 week then 20 mg daily x 1 week then 10 mg daily x 1 week.  Patient has been asked to call for any increase in symptoms as he tapers prednisone. For now continue Asa call 800 mg p.o. 3 times daily Patient encouraged to gradually advance diet, soft bland. Check fecal calprotectin, sed rate, CRP, hepatitis B serologies, QuantiFERON gold and TPMT enzyme activity. He will need to get started on biologic therapy, this was discussed briefly with the patient today.  I will discuss with Dr. Bryan Lemma and we will initiate approval from his insurance regarding Remicade versus Humira versus Entyvio. Once he has been initiated on biologic therapy can discontinue Asacol as no indication for ileocolitis. Have also scheduled him for a follow-up with Dr. Bryan Lemma. Patient knows to call should he have any worsening of symptoms in the interim while we are in the process of initiating biologic therapy.  Torrie Lafavor S Treyvion Durkee PA-C 04/27/2022   Cc: Copland, Gay Filler, MD

## 2022-04-30 ENCOUNTER — Telehealth: Payer: Self-pay

## 2022-04-30 NOTE — Telephone Encounter (Signed)
Secure staff message sent to Christopher Nguyen at the [facility] infusion center to see if she is able to assist with finding the preferred biologic through patient's insurance.

## 2022-04-30 NOTE — Telephone Encounter (Signed)
-----   Message from Alfredia Ferguson, [place] sent at 04/28/2022 11:42 AM EDT ----- Regarding: RE: Biologic RX  ----- Message ----- From: Lavena Bullion, DO Sent: 04/27/2022   5:32 PM EDT To: Alfredia Ferguson, PA-C Subject: RE: Biologic RX                                Thanks for seeing him today.  Completely agree that we need to escalate to biologic therapy.  Based on his disease, which ever his insurance covers between Remicade, Humira, Weyman Rodney is perfectly acceptable as he does not have a history of penetrating or high-grade stricturing disease.  [name]  ----- Message ----- From: Leotis Pain Sent: 04/27/2022   5:01 PM EDT To: Lavena Bullion, DO; Yevette Edwards, RN Subject: Biologic RX                                    I saw this patient in clinic today, please see my note, several labs are pending. [name] he needs to be initiated on biologic therapy.  Previously felt to have Crohn's colitis, now on CT done with ER visit this past weekend has evidence of Crohn's ileocolitis.  Darryn Kydd, please sort out through his insurance company what biologic will be covered, Remicade/Humira/Entyvio  [name], [name] he has follow-up with you in 4 to 5 weeks but we may be able to get something initiated in the interim, currently on prednisone and I did a taper over about 6 weeks.

## 2022-05-01 LAB — CALPROTECTIN, FECAL: Calprotectin, Fecal: 508 ug/g — ABNORMAL HIGH (ref 0–120)

## 2022-05-18 NOTE — Telephone Encounter (Signed)
Dr. Marlynn Perking, Preferred medication with [employer] is Avsola.  Remicade is non-preferred if patient has not failed Avsola. Would you like to use Avsola??  Auth Submission:  Site of care: Site of care: [facility] Payer: [employer] Medication & CPT/J Code(s) submitted: Avsola (infliximab-axxq) [id number] Route of submission (phone, fax, portal):  Phone # Fax # Auth type: Buy/Bill Units/visits requested: 360 UNITS - 6 DOSES Reference number: [id number] Approval from: 05/14/22 to 05/14/23

## 2022-05-19 ENCOUNTER — Telehealth: Payer: Self-pay | Admitting: Pharmacy Technician

## 2022-05-19 ENCOUNTER — Encounter: Payer: Self-pay | Admitting: Gastroenterology

## 2022-05-19 ENCOUNTER — Other Ambulatory Visit: Payer: Self-pay | Admitting: Family Medicine

## 2022-05-19 DIAGNOSIS — I10 Essential (primary) hypertension: Secondary | ICD-10-CM

## 2022-05-19 NOTE — Telephone Encounter (Signed)
[place], Patient has been approved for Avsola and will be scheduled as soon as possible.  Thanks Maudie Mercury

## 2022-05-19 NOTE — Telephone Encounter (Signed)
[place], [place] note:  Auth Submission: APPROVED Site of care: Site of care: [facility] Payer: BCBS Medication & CPT/J Code(s) submitted: Avsola (infliximab-axxq) [phone] Route of submission (phone, fax, portal):  Phone # Fax # Auth type: Buy/Bill Units/visits requested: 360 UNITS - 8 DOSES Reference number: [id number] Approval from: 05/14/22 to 05/14/23   AVSOLA CO-PAY CARD: APPROVED ID: [id number] BIN: [id number] PCN: [id number] GR: [id number] Exp: 11/15/25

## 2022-05-19 NOTE — Telephone Encounter (Signed)
Kim, please proceed with Avsola. I have entered the order. Thanks

## 2022-05-19 NOTE — Addendum Note (Signed)
Addended by: Yevette Edwards on: 05/19/2022 09:20 AM   Modules accepted: Orders

## 2022-05-19 NOTE — Telephone Encounter (Signed)
Noted, thank you

## 2022-05-21 LAB — HEPATITIS B SURFACE ANTIGEN: Hepatitis B Surface Ag: NONREACTIVE

## 2022-05-21 LAB — QUANTIFERON-TB GOLD PLUS
Mitogen-NIL: >10 IU/mL
NIL: 0.05 IU/mL
QuantiFERON-TB Gold Plus: NEGATIVE
TB1-NIL: 0 IU/mL
TB2-NIL: <0 IU/mL

## 2022-05-21 LAB — HEPATITIS B SURFACE ANTIBODY,QUALITATIVE: Hep B S Ab: REACTIVE — AB

## 2022-05-21 LAB — THIOPURINE METHYLTRANSFERASE (TPMT), RBC: Thiopurine Methyltransferase, RBC: 7 nmol/hr/mL RBC — ABNORMAL LOW

## 2022-05-25 ENCOUNTER — Ambulatory Visit: Payer: BC Managed Care – PPO | Admitting: Gastroenterology

## 2022-05-25 ENCOUNTER — Encounter: Payer: Self-pay | Admitting: Gastroenterology

## 2022-05-25 VITALS — BP 122/68 | HR 78 | Ht 63.0 in | Wt 164.0 lb

## 2022-05-25 DIAGNOSIS — K5090 Crohn's disease, unspecified, without complications: Secondary | ICD-10-CM | POA: Diagnosis not present

## 2022-05-25 DIAGNOSIS — K2990 Gastroduodenitis, unspecified, without bleeding: Secondary | ICD-10-CM

## 2022-05-25 DIAGNOSIS — K31A19 Gastric intestinal metaplasia without dysplasia, unspecified site: Secondary | ICD-10-CM

## 2022-05-25 DIAGNOSIS — K2970 Gastritis, unspecified, without bleeding: Secondary | ICD-10-CM

## 2022-05-25 DIAGNOSIS — Z800 Family history of malignant neoplasm of digestive organs: Secondary | ICD-10-CM

## 2022-05-25 NOTE — Progress Notes (Signed)
Chief Complaint:    Crohn's disease, discussion medications  GI History: [age] male with history of HTN, initially seen in the GI clinic on 03/28/2020 for evaluation of RUQ pain, change in bowel habits, nonbloody diarrhea and subsequently diagnosed with Crohn's Disease as below.   1) RUQ pain, biliary dyskinesia: Started 07/2019. Typically post prandial RUQ pain and also post prandial BM. Sxs intermittent. No radiation, n/v/f/c.  Rare episodes that wake him from sleep. No improvement with trial of Prilosec.  -RUQ US (08/2019): Fatty liver, otherwise normal -08/2019: WBC 11.5, otherwise normal CBC, CMP, acute viral hep panel -02/2020: WBC 10.6, otherwise normal CBC, CMP, amylase, lipase -04/2020: Normal tTG -04/2020: HIDA scan: EF 8% consistent with biliary dyskinesia/chronic cholecystitis.  Referred to General Surgery, patient declined referral   2) Crohn's Disease (Crohn's Colitis initially, now ileocolitis): Diagnosed by colonoscopy in 04/2020.  Index symptoms of 6-7 loose/watery, nonbloody stools per day, +urgency. +mucus-like stools, with associated RUQ pain.   -04/2020: Fecal calprotectin 915.  Normal ESR, CRP, celiac panel.  Negative GI PCR panel -Colonoscopy 04/2020 with mild patchy colitis from sigmoid to cecum with areas of normal mucosa.  Biopsies consistent with active, chronic colitis.  Relative rectal sparing, all suggestive of Crohn's colitis.  Normal TI.  Prescribed Lialda 4.8 g/day, but changed to Asacol HD due to patient concerns re tablet size - 05/2020: GI follow-up.  Reported clinical improvement since starting Asacol HD. - 08/2020: Fecal calprotectin 38 - 04/26/2022: ER evaluation for Crohn's flare.  CT with long segment mural thickening and hyperenhancement in the mid to distal small bowel loops with short segment mucosal hyperenhancement of the terminal ileum, proximal transverse colon, and sigmoid consistent with active Crohn's Disease.  WBC 14 -04/27/2022: Follow-up in GI clinic.   Started on prednisone with taper.  Fecal calprotectin 580    3) Family history of colon cancer: Father with colon cancer, diagnosed in his 6860s and died at age 37   4) Hepatic steatosis: Incidentally noted on RUQ US   Endoscopic History: -EGD (05/01/2020, Dr. Barron Alvineirigliano): 6 mm SEL in the antrum (path: Gastritis without dysplasia/metaplasia), Otherwise normal stomach (biopsies negative for H. pylori) and duodenum (biopsies: Increased IELs without villous flattening).  Repeat endoscopy in 1 year for surveillance of antral nodule -Colonoscopy (05/01/2020, Dr. Barron Alvineirigliano): Patchy mild colitis from sigmoid to cecum, with areas of normal mucosa (path: Moderate to severe active, chronic colitis).  Rectal sparing, with transition to normal mucosa at 18 cm.  Normal TI. - EGD (03/2021): Normal esophagus, 6 mm submucosal nodule in gastric antrum (path: Focal intestinal metaplasia), otherwise normal stomach and duodenum (duodenal path benign) - Colonoscopy (03/2021): Localized mild inflammation at appendiceal orifice (path: Chronic, active colitis).  Remainder of the colon was normal (path: Chronic, active colitis in cecum and ascending colon, otherwise normal throughout remainder) - EUS (09/2021): 7 mm submucosal nodule in gastric antrum, but on EUS was more consistent with extraluminal compression from normal-appearing gallbladder (no path).  No lymphadenopathy.  Gastric biopsies performed per GIM mapping protocol (path: Focal intestinal metaplasia without dysplasia and antrum; otherwise normal/benign and remainder of stomach)  HPI:     Patient is a 37 y.o. male presenting to the Gastroenterology Clinic for follow-up.  Was last seen in the GI clinic by Mike GipAmy Esterwood on 04/27/2022 for Crohn's flare with ileal involvement.  Was treated with prednisone with plan to escalate to biologic therapy.  - Fecal calprotectin 508 - ESR 18, normal CRP - Quantified on gold negative/normal - HBsAb+, HBsAg- -  TPMT 7  (heterozygote or low metabolizer)  Completed steroid wean 2 days ago. Was doing well, but some sxs started to return at end of taper.   Scheduled for Avsola tomorrow.    Review of systems:     No chest pain, no SOB, no fevers, no urinary sx   Past Medical History:  Diagnosis Date   Azoospermia due to obstruction    Crohn disease    Hypertension    Low testosterone     Patient's surgical history, family medical history, social history, medications and allergies were all reviewed in Epic    Current Outpatient Medications  Medication Sig Dispense Refill   amLODipine (NORVASC) 5 MG tablet TAKE ONE TABLET BY MOUTH ONE TIME DAILY 30 tablet 0   FLUoxetine (PROZAC) 20 MG capsule Take 1 capsule (20 mg total) by mouth daily. 90 capsule 3   Mesalamine 800 MG TBEC Take 800 mg by mouth in the morning, at noon, and at bedtime.     Multiple Vitamin (MULTIVITAMIN) tablet Take 1 tablet by mouth daily.     ondansetron (ZOFRAN) 4 MG tablet Take 1 tablet (4 mg total) by mouth every 6 (six) hours. 15 tablet 0   testosterone cypionate (DEPOTESTOSTERONE CYPIONATE) 200 MG/ML injection Inject 100 mg into the muscle every Sunday.     predniSONE (DELTASONE) 10 MG tablet Take 4 tablets (40 mg total) by mouth daily with breakfast for 7 days, THEN 3 tablets (30 mg total) daily with breakfast for 7 days, THEN 2 tablets (20 mg total) daily with breakfast for 7 days, THEN 1 tablet (10 mg total) daily with breakfast for 7 days. (Patient not taking: Reported on 05/25/2022) 100 tablet 0   No current facility-administered medications for this visit.    Physical Exam:     BP 122/68   Pulse 78   Ht 5\' 3"  (1.6 m)   Wt 164 lb (74.4 kg)   BMI 29.05 kg/m   GENERAL:  Pleasant male in NAD PSYCH: : Cooperative, normal affect Musculoskeletal:  Normal muscle tone, normal strength NEURO: Alert and oriented x 3, no focal neurologic deficits   IMPRESSION and PLAN:    1) Crohn's Disease 37 year old male diagnosed  with Crohn's colitis in 04/2020, and had been doing well on acyclovir monotherapy.  Recent flare with CT demonstrating small bowel disease as well.  Good response to prednisone, and scheduled for induction with Avsola tomorrow. - Proceed with Avsola tomorrow as scheduled - Discussed risk/benefit profile of biologic therapy at length today, and he would like to proceed with Avsola as scheduled - Repeat fecal calprotectin in 12 weeks for noninvasive response to therapy - Tentative plan for repeat colonoscopy in 6-12 months to assess for deep remission - Ok to stop Asacol once Avsola has started - Will discuss Shingrix vaccine at follow-up.  Otherwise UTD on vaccines - Dermatology referral at follow-up  2) Gastritis 3) Gastric intestinal metaplasia - Repeat upper endoscopy in 2026 for GIM mapping and continued surveillance - The previously noted submucosal antral lesion turned out to be extraluminal compression from the gallbladder on EUS.  No continued surveillance needed  4) Family history of colon cancer - Undergoing frequent surveillance due to Crohn's Disease already  RTC in 6 months or sooner prn            Shellia Cleverly ,DO, FACG 05/25/2022, 10:18 AM

## 2022-05-25 NOTE — Patient Instructions (Addendum)
Please return stool sample in 12 weeks.  _______________________________________________________  If your blood pressure at your visit was 140/90 or greater, please contact your primary care physician to follow up on this.  _______________________________________________________  If you are age 37 or older, your body mass index should be between 23-30. Your Body mass index is 29.05 kg/m. If this is out of the aforementioned range listed, please consider follow up with your Primary Care Provider.  If you are age 45 or younger, your body mass index should be between 19-25. Your Body mass index is 29.05 kg/m. If this is out of the aformentioned range listed, please consider follow up with your Primary Care Provider.   __________________________________________________________  The [facility] providers would like to encourage you to use [facility] to communicate with providers for non-urgent requests or questions.  Due to long hold times on the telephone, sending your provider a message by [facility] may be a faster and more efficient way to get a response.  Please allow 48 business hours for a response.  Please remember that this is for non-urgent requests.    Due to recent changes in healthcare laws, you may see the results of your imaging and laboratory studies on MyChart before your provider has had a chance to review them.  We understand that in some cases there may be results that are confusing or concerning to you. Not all laboratory results come back in the same time frame and the provider may be waiting for multiple results in order to interpret others.  Please give [place] 48 hours in order for your provider to thoroughly review all the results before contacting the office for clarification of your results.    Thank you for choosing me and [facility].  [name] Cirigliano, D.O.

## 2022-05-26 ENCOUNTER — Ambulatory Visit (INDEPENDENT_AMBULATORY_CARE_PROVIDER_SITE_OTHER): Payer: BC Managed Care – PPO

## 2022-05-26 VITALS — BP 136/79 | HR 70 | Temp 98.1°F | Resp 20 | Ht 63.0 in | Wt 167.4 lb

## 2022-05-26 DIAGNOSIS — K5090 Crohn's disease, unspecified, without complications: Secondary | ICD-10-CM

## 2022-05-26 MED ORDER — DIPHENHYDRAMINE HCL 25 MG PO CAPS
25.0000 mg | ORAL_CAPSULE | Freq: Once | ORAL | Status: AC
  Administered 2022-05-26: 25 mg via ORAL
  Filled 2022-05-26: qty 1

## 2022-05-26 MED ORDER — SODIUM CHLORIDE 0.9 % IV SOLN
5.0000 mg/kg | Freq: Once | INTRAVENOUS | Status: AC
  Administered 2022-05-26: 400 mg via INTRAVENOUS
  Filled 2022-05-26: qty 40

## 2022-05-26 MED ORDER — METHYLPREDNISOLONE SODIUM SUCC 40 MG IJ SOLR
40.0000 mg | Freq: Once | INTRAMUSCULAR | Status: AC
  Administered 2022-05-26: 40 mg via INTRAVENOUS
  Filled 2022-05-26: qty 1

## 2022-05-26 MED ORDER — ACETAMINOPHEN 325 MG PO TABS
650.0000 mg | ORAL_TABLET | Freq: Once | ORAL | Status: AC
  Administered 2022-05-26: 650 mg via ORAL
  Filled 2022-05-26: qty 2

## 2022-05-26 NOTE — Progress Notes (Signed)
Diagnosis: Crohn's Disease  Provider:  Chilton Greathouse MD  Procedure: Infusion  IV Type: Peripheral, IV Location: R Forearm  Avsola (infliximab-axxq), Dose: 400 mg  Infusion Start Time: 1141  Infusion Stop Time: 1345  Post Infusion IV Care: Observation period completed  Discharge: Condition: Good, Destination: Home . AVS Provided   PIV d/ced    Performed by:  Marlow Baars [place], RN

## 2022-06-09 ENCOUNTER — Ambulatory Visit: Payer: BC Managed Care – PPO

## 2022-06-11 ENCOUNTER — Ambulatory Visit (INDEPENDENT_AMBULATORY_CARE_PROVIDER_SITE_OTHER): Payer: BC Managed Care – PPO

## 2022-06-11 VITALS — BP 136/77 | HR 88 | Temp 98.7°F | Resp 18 | Ht 63.0 in | Wt 167.2 lb

## 2022-06-11 DIAGNOSIS — K5090 Crohn's disease, unspecified, without complications: Secondary | ICD-10-CM

## 2022-06-11 MED ORDER — DIPHENHYDRAMINE HCL 25 MG PO CAPS
25.0000 mg | ORAL_CAPSULE | Freq: Once | ORAL | Status: AC
  Administered 2022-06-11: 25 mg via ORAL
  Filled 2022-06-11: qty 1

## 2022-06-11 MED ORDER — ACETAMINOPHEN 325 MG PO TABS
650.0000 mg | ORAL_TABLET | Freq: Once | ORAL | Status: AC
  Administered 2022-06-11: 650 mg via ORAL
  Filled 2022-06-11: qty 2

## 2022-06-11 MED ORDER — SODIUM CHLORIDE 0.9 % IV SOLN
5.0000 mg/kg | Freq: Once | INTRAVENOUS | Status: AC
  Administered 2022-06-11: 400 mg via INTRAVENOUS
  Filled 2022-06-11: qty 40

## 2022-06-11 MED ORDER — METHYLPREDNISOLONE SODIUM SUCC 40 MG IJ SOLR
40.0000 mg | Freq: Once | INTRAMUSCULAR | Status: AC
  Administered 2022-06-11: 40 mg via INTRAVENOUS
  Filled 2022-06-11: qty 1

## 2022-06-11 NOTE — Progress Notes (Signed)
Diagnosis: Crohn's Disease  Provider:  Chilton Greathouse MD  Procedure: IV Infusion  IV Type: Peripheral, IV Location: R Hand  Avsola (infliximab-axxq), Dose:   Infusion Start Time: 1355  Infusion Stop Time: 1612  Post Infusion IV Care: Peripheral IV Discontinued  Discharge: Condition: Good, Destination: Home . AVS Declined  Performed by:  Garnette Czech, RN

## 2022-06-14 NOTE — Progress Notes (Unsigned)
[facility] [address], [address] [place], [place] [address] [phone]  Date:  06/17/2022   Name:  Christopher Nguyen   DOB:  1985-12-19   MRN:  [id number]  PCP:  Pearline Cables, MD    Chief Complaint: follow up HTN (Last over 1 year/Hypertension -- follow-up for use of a Amlodipine and refills/HIV screen due)   History of Present Illness:  Christopher Nguyen is a 37 y.o. very pleasant male patient who presents with the following:  Patient seen today for hypertension follow-up Also history of Crohn's disease, low testosterone, overweight Most recent visit with myself was a virtual visit about a year ago for concern of anxiety  He has regular GI follow-up for history of Crohn's disease as well as colon cancer in his dad-he was seen by Dr. Barron Alvine last month. Avsola is his new biologic - 2 infusions so far, he is feeling much better   Currently taking amlodipine for hyper tension, also fluoxetine-both working well   Dr. 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 Sons are 6 and 3 yo- doing well He is running for exercise- no CP or SOB  Patient Active Problem List   Diagnosis Date Noted   Crohn's disease (HCC) 11/09/2020   Overweight 09/18/2015   Essential hypertension, benign 09/18/2015    Past Medical History:  Diagnosis Date   Azoospermia due to obstruction    Crohn disease (HCC)    Hypertension    Low testosterone     Past Surgical History:  Procedure Laterality Date   BIOPSY  10/15/2021   Procedure: BIOPSY;  Surgeon: Lemar Lofty., MD;  Location: [facility];  Service: Gastroenterology;;   CLUB FOOT RELEASE  infant    COLONOSCOPY     ESOPHAGOGASTRODUODENOSCOPY N/A 10/15/2021   Procedure: ESOPHAGOGASTRODUODENOSCOPY (EGD);  Surgeon: Lemar Lofty., MD;  Location: [facility];  Service: Gastroenterology;  Laterality: N/A;   EUS N/A 10/15/2021   Procedure: UPPER ENDOSCOPIC ULTRASOUND (EUS) RADIAL;  Surgeon: Lemar Lofty., MD;  Location: [facility];  Service: Gastroenterology;  Laterality: N/A;   TESTICLE BIOPSY Bilateral 01/28/2015   Procedure: BIOPSY TESTICULAR;  Surgeon: Jethro Bolus, MD;  Location: [facility];  Service: Urology;  Laterality: Bilateral;   UPPER GASTROINTESTINAL ENDOSCOPY      Social History   Tobacco Use   Smoking status: Never   Smokeless tobacco: Never  Vaping Use   Vaping Use: Never used  Substance Use Topics   Alcohol use: Yes    Comment: weekly   Drug use: No    Family History  Problem Relation Age of Onset   Bladder Cancer Mother    Colon cancer Father    Hypertension Father    Hypertension Brother    Melanoma Brother    Alcoholism Maternal Grandmother    Alcohol abuse Maternal Grandfather    Alcoholism Maternal Grandfather    Esophageal cancer Neg Hx    Rectal cancer Neg Hx    Stomach cancer Neg Hx     Allergies  Allergen Reactions   Bentyl [Dicyclomine] Swelling   Cephalosporins Other (See Comments)  Unknown childhood reaction    Medication list has been reviewed and updated.  Current Outpatient Medications on File Prior to Visit  Medication Sig Dispense Refill   amLODipine (NORVASC) 5 MG tablet TAKE ONE TABLET BY MOUTH ONE TIME DAILY 30 tablet 0   FLUoxetine (PROZAC) 20 MG capsule Take 1 capsule (20 mg total) by mouth daily. 90 capsule 3   Multiple Vitamin (MULTIVITAMIN) tablet Take 1 tablet by mouth daily.     ondansetron (ZOFRAN) 4 MG tablet Take 1 tablet (4 mg total) by mouth every 6 (six) hours. 15 tablet 0   testosterone cypionate (DEPOTESTOSTERONE CYPIONATE) 200 MG/ML injection Inject 100 mg into the  muscle every Sunday.     No current facility-administered medications on file prior to visit.    Review of Systems:  As per HPI- otherwise negative.   Physical Examination: Vitals:   06/17/22 0840  BP: 136/84  Pulse: 74  Resp: 18  Temp: 98.1 F (36.7 C)  SpO2: 96%   Vitals:   06/17/22 0840  Weight: 170 lb (77.1 kg)  Height: 5\' 3"  (1.6 m)   Body mass index is 30.11 kg/m. Ideal Body Weight: Weight in (lb) to have BMI = 25: 140.8  GEN: no acute distress.  Overweight, looks well  HEENT: Atraumatic, Normocephalic.  Bilateral TM wnl, oropharynx normal.  PEERL,EOMI.   Ears and Nose: No external deformity. CV: RRR, No M/G/R. No JVD. No thrill. No extra heart sounds. PULM: CTA B, no wheezes, crackles, rhonchi. No retractions. No resp. distress. No accessory muscle use. ABD: S, NT, ND, +BS. No rebound. No HSM. EXTR: No c/c/e PSYCH: Normally interactive. Conversant.    Assessment and Plan: GAD (generalized anxiety disorder) - Plan: FLUoxetine (PROZAC) 20 MG capsule  Essential hypertension, benign - Plan: amLODipine (NORVASC) 5 MG tablet  Crohn's disease without complication, unspecified gastrointestinal tract location (HCC)  Polycythemia - Plan: CBC  Screening for diabetes mellitus - Plan: Hemoglobin A1c  Screening for hyperlipidemia - Plan: Lipid panel  Doing well today Polychtemia likely due to T therapy - he does donate blood on a regular basis  He is in grad school and is required to have a dose of meningitis vaccine- he would like to go ahead and get this done.  He does not live in a dorm but he is around people who do so vaccination is reasonable Signed Abbe Amsterdam, MD  Received labs as below, message to patient  Results for orders placed or performed in visit on 06/17/22  CBC  Result Value Ref Range   WBC 6.8 4.0 - 10.5 K/uL   RBC 5.04 4.22 - 5.81 Mil/uL   Platelets 356.0 150.0 - 400.0 K/uL   Hemoglobin 14.7 13.0 - 17.0 g/dL   HCT 96.0 45.4 - 09.8 %    MCV 86.3 78.0 - 100.0 fl   MCHC 33.8 30.0 - 36.0 g/dL   RDW 11.9 14.7 - 82.9 %  Hemoglobin A1c  Result Value Ref Range   Hgb A1c MFr Bld 5.3 4.6 - 6.5 %  Lipid panel  Result Value Ref Range   Cholesterol 221 (H) 0 - 200 mg/dL   Triglycerides 562.1 (H) 0.0 - 149.0 mg/dL   HDL 30.86 >57.84 mg/dL   VLDL 69.6 0.0 - 29.5 mg/dL   LDL Cholesterol 284 (H) 0 - 99 mg/dL   Total CHOL/HDL Ratio 4    NonHDL 171.05   Ferritin  Result Value Ref Range   Ferritin 10.8 (L) 22.0 - 322.0 ng/mL

## 2022-06-14 NOTE — Patient Instructions (Incomplete)
It was good to see you again today!  Recommend COVID booster if not done in the last 9 months or so I will be in touch with your labs [place] job with exercise!    Assuming all is well let's recheck in 6- 12 months   We gave you a dose of meningitis vaccine today- you should just need this one dose

## 2022-06-17 ENCOUNTER — Ambulatory Visit: Payer: BC Managed Care – PPO | Admitting: Family Medicine

## 2022-06-17 ENCOUNTER — Encounter: Payer: Self-pay | Admitting: Family Medicine

## 2022-06-17 VITALS — BP 136/84 | HR 74 | Temp 98.1°F | Resp 18 | Ht 63.0 in | Wt 170.0 lb

## 2022-06-17 DIAGNOSIS — D751 Secondary polycythemia: Secondary | ICD-10-CM

## 2022-06-17 DIAGNOSIS — I10 Essential (primary) hypertension: Secondary | ICD-10-CM | POA: Diagnosis not present

## 2022-06-17 DIAGNOSIS — F411 Generalized anxiety disorder: Secondary | ICD-10-CM

## 2022-06-17 DIAGNOSIS — Z23 Encounter for immunization: Secondary | ICD-10-CM | POA: Diagnosis not present

## 2022-06-17 DIAGNOSIS — Z131 Encounter for screening for diabetes mellitus: Secondary | ICD-10-CM | POA: Diagnosis not present

## 2022-06-17 DIAGNOSIS — K5090 Crohn's disease, unspecified, without complications: Secondary | ICD-10-CM | POA: Diagnosis not present

## 2022-06-17 DIAGNOSIS — Z13220 Encounter for screening for lipoid disorders: Secondary | ICD-10-CM | POA: Diagnosis not present

## 2022-06-17 LAB — LIPID PANEL
Cholesterol: 221 mg/dL — ABNORMAL HIGH (ref 0–200)
HDL: 50.3 mg/dL (ref >39.00)
LDL Cholesterol: 139 mg/dL — ABNORMAL HIGH (ref 0–99)
NonHDL: 171.05
Total CHOL/HDL Ratio: 4
Triglycerides: 161 mg/dL — ABNORMAL HIGH (ref 0.0–149.0)
VLDL: 32.2 mg/dL (ref 0.0–40.0)

## 2022-06-17 LAB — HEMOGLOBIN A1C: Hgb A1c MFr Bld: 5.3 % (ref 4.6–6.5)

## 2022-06-17 LAB — CBC
HCT: 43.5 % (ref 39.0–52.0)
Hemoglobin: 14.7 g/dL (ref 13.0–17.0)
MCHC: 33.8 g/dL (ref 30.0–36.0)
MCV: 86.3 fl (ref 78.0–100.0)
Platelets: 356 10*3/uL (ref 150.0–400.0)
RBC: 5.04 Mil/uL (ref 4.22–5.81)
RDW: 14.1 % (ref 11.5–15.5)
WBC: 6.8 10*3/uL (ref 4.0–10.5)

## 2022-06-17 LAB — FERRITIN: Ferritin: 10.8 ng/mL — ABNORMAL LOW (ref 22.0–322.0)

## 2022-06-17 MED ORDER — FLUOXETINE HCL 20 MG PO CAPS: 20.0000 mg | ORAL_CAPSULE | Freq: Every day | ORAL | 3 refills | Status: DC

## 2022-06-17 MED ORDER — AMLODIPINE BESYLATE 5 MG PO TABS: 5.0000 mg | ORAL_TABLET | Freq: Every day | ORAL | 3 refills | Status: DC

## 2022-07-10 ENCOUNTER — Ambulatory Visit: Payer: BC Managed Care – PPO

## 2022-07-16 ENCOUNTER — Ambulatory Visit (INDEPENDENT_AMBULATORY_CARE_PROVIDER_SITE_OTHER): Payer: BC Managed Care – PPO

## 2022-07-16 VITALS — BP 144/87 | HR 76 | Temp 97.9°F | Resp 18 | Ht 63.0 in | Wt 171.0 lb

## 2022-07-16 DIAGNOSIS — K5090 Crohn's disease, unspecified, without complications: Secondary | ICD-10-CM

## 2022-07-16 MED ORDER — METHYLPREDNISOLONE SODIUM SUCC 40 MG IJ SOLR
40.0000 mg | Freq: Once | INTRAMUSCULAR | Status: AC
  Administered 2022-07-16: 40 mg via INTRAVENOUS
  Filled 2022-07-16: qty 1

## 2022-07-16 MED ORDER — SODIUM CHLORIDE 0.9 % IV SOLN
5.0000 mg/kg | Freq: Once | INTRAVENOUS | Status: AC
  Administered 2022-07-16: 400 mg via INTRAVENOUS
  Filled 2022-07-16: qty 40

## 2022-07-16 MED ORDER — ACETAMINOPHEN 325 MG PO TABS
650.0000 mg | ORAL_TABLET | Freq: Once | ORAL | Status: AC
  Administered 2022-07-16: 650 mg via ORAL
  Filled 2022-07-16: qty 2

## 2022-07-16 MED ORDER — DIPHENHYDRAMINE HCL 25 MG PO CAPS
25.0000 mg | ORAL_CAPSULE | Freq: Once | ORAL | Status: AC
  Administered 2022-07-16: 25 mg via ORAL
  Filled 2022-07-16: qty 1

## 2022-07-16 NOTE — Progress Notes (Signed)
Diagnosis: Crohn's Disease  Provider:  Chilton Greathouse MD  Procedure: IV Infusion  IV Type: Peripheral, IV Location: R Hand  Avsola (infliximab-axxq), Dose: 400 mg  Infusion Start Time: 1025  Infusion Stop Time: 1240  Post Infusion IV Care: Peripheral IV Discontinued  Discharge: Condition: Good, Destination: Home . AVS Declined  Performed by:  Adriana Mccallum, RN

## 2022-08-18 DIAGNOSIS — Z125 Encounter for screening for malignant neoplasm of prostate: Secondary | ICD-10-CM | POA: Diagnosis not present

## 2022-08-18 DIAGNOSIS — E291 Testicular hypofunction: Secondary | ICD-10-CM | POA: Diagnosis not present

## 2022-08-19 ENCOUNTER — Other Ambulatory Visit: Payer: BC Managed Care – PPO

## 2022-08-25 DIAGNOSIS — D751 Secondary polycythemia: Secondary | ICD-10-CM | POA: Diagnosis not present

## 2022-08-25 DIAGNOSIS — E291 Testicular hypofunction: Secondary | ICD-10-CM | POA: Diagnosis not present

## 2022-09-09 ENCOUNTER — Ambulatory Visit: Payer: BC Managed Care – PPO

## 2022-09-09 DIAGNOSIS — K5090 Crohn's disease, unspecified, without complications: Secondary | ICD-10-CM

## 2022-09-10 ENCOUNTER — Ambulatory Visit (INDEPENDENT_AMBULATORY_CARE_PROVIDER_SITE_OTHER): Payer: BC Managed Care – PPO

## 2022-09-10 VITALS — BP 147/80 | HR 68 | Temp 98.3°F | Resp 18 | Ht 63.0 in | Wt 169.0 lb

## 2022-09-10 DIAGNOSIS — K5090 Crohn's disease, unspecified, without complications: Secondary | ICD-10-CM

## 2022-09-10 MED ORDER — METHYLPREDNISOLONE SODIUM SUCC 40 MG IJ SOLR
40.0000 mg | Freq: Once | INTRAMUSCULAR | Status: AC
  Administered 2022-09-10: 40 mg via INTRAVENOUS
  Filled 2022-09-10: qty 1

## 2022-09-10 MED ORDER — ACETAMINOPHEN 325 MG PO TABS
650.0000 mg | ORAL_TABLET | Freq: Once | ORAL | Status: AC
  Administered 2022-09-10: 650 mg via ORAL
  Filled 2022-09-10: qty 2

## 2022-09-10 MED ORDER — SODIUM CHLORIDE 0.9 % IV SOLN
5.0000 mg/kg | Freq: Once | INTRAVENOUS | Status: AC
  Administered 2022-09-10: 400 mg via INTRAVENOUS
  Filled 2022-09-10: qty 40

## 2022-09-10 MED ORDER — DIPHENHYDRAMINE HCL 25 MG PO CAPS
25.0000 mg | ORAL_CAPSULE | Freq: Once | ORAL | Status: AC
  Administered 2022-09-10: 25 mg via ORAL
  Filled 2022-09-10: qty 1

## 2022-09-10 NOTE — Progress Notes (Signed)
Diagnosis: Crohn's Disease  Provider:  Chilton Greathouse MD  Procedure: IV Infusion  IV Type: Peripheral, IV Location: R Forearm  Avsola (infliximab-axxq), Dose: 400 mg  Infusion Start Time: 1019  Infusion Stop Time: 1241  Post Infusion IV Care: Peripheral IV Discontinued  Discharge: Condition: Good, Destination: Home . AVS Declined  Performed by:  Adriana Mccallum, RN

## 2022-11-05 ENCOUNTER — Ambulatory Visit (INDEPENDENT_AMBULATORY_CARE_PROVIDER_SITE_OTHER): Payer: BC Managed Care – PPO

## 2022-11-05 VITALS — BP 128/85 | HR 73 | Temp 97.9°F | Resp 16 | Ht 63.0 in | Wt 171.0 lb

## 2022-11-05 DIAGNOSIS — K5090 Crohn's disease, unspecified, without complications: Secondary | ICD-10-CM | POA: Diagnosis not present

## 2022-11-05 MED ORDER — SODIUM CHLORIDE 0.9 % IV SOLN
5.0000 mg/kg | Freq: Once | INTRAVENOUS | Status: AC
  Administered 2022-11-05: 400 mg via INTRAVENOUS
  Filled 2022-11-05: qty 40

## 2022-11-05 MED ORDER — DIPHENHYDRAMINE HCL 25 MG PO CAPS
25.0000 mg | ORAL_CAPSULE | Freq: Once | ORAL | Status: AC
  Administered 2022-11-05: 25 mg via ORAL
  Filled 2022-11-05: qty 1

## 2022-11-05 MED ORDER — ACETAMINOPHEN 325 MG PO TABS
650.0000 mg | ORAL_TABLET | Freq: Once | ORAL | Status: AC
  Administered 2022-11-05: 650 mg via ORAL
  Filled 2022-11-05: qty 2

## 2022-11-05 MED ORDER — METHYLPREDNISOLONE SODIUM SUCC 40 MG IJ SOLR
40.0000 mg | Freq: Once | INTRAMUSCULAR | Status: AC
  Administered 2022-11-05: 40 mg via INTRAVENOUS
  Filled 2022-11-05: qty 1

## 2022-11-05 NOTE — Progress Notes (Signed)
Diagnosis: Crohn's Disease  Provider:  Chilton Greathouse MD  Procedure: IV Infusion  IV Type: Peripheral, IV Location: R Forearm  Avsola (infliximab-axxq), Dose: 400 mg  Infusion Start Time: 1027  Infusion Stop Time: 1235  Post Infusion IV Care: Peripheral IV Discontinued  Discharge: Condition: Good, Destination: Home . AVS Declined  Performed by:  Wyvonne Lenz, RN

## 2023-01-01 ENCOUNTER — Ambulatory Visit (INDEPENDENT_AMBULATORY_CARE_PROVIDER_SITE_OTHER): Payer: BC Managed Care – PPO

## 2023-01-01 VITALS — BP 155/103 | HR 83 | Temp 98.1°F | Resp 16 | Ht 63.0 in | Wt 167.8 lb

## 2023-01-01 DIAGNOSIS — K5090 Crohn's disease, unspecified, without complications: Secondary | ICD-10-CM

## 2023-01-01 MED ORDER — ACETAMINOPHEN 325 MG PO TABS
650.0000 mg | ORAL_TABLET | Freq: Once | ORAL | Status: AC
  Administered 2023-01-01: 650 mg via ORAL
  Filled 2023-01-01: qty 2

## 2023-01-01 MED ORDER — METHYLPREDNISOLONE SODIUM SUCC 40 MG IJ SOLR
40.0000 mg | Freq: Once | INTRAMUSCULAR | Status: AC
  Administered 2023-01-01: 40 mg via INTRAVENOUS
  Filled 2023-01-01: qty 1

## 2023-01-01 MED ORDER — SODIUM CHLORIDE 0.9 % IV SOLN
5.0000 mg/kg | Freq: Once | INTRAVENOUS | Status: AC
  Administered 2023-01-01: 400 mg via INTRAVENOUS
  Filled 2023-01-01: qty 40

## 2023-01-01 MED ORDER — DIPHENHYDRAMINE HCL 25 MG PO CAPS
25.0000 mg | ORAL_CAPSULE | Freq: Once | ORAL | Status: AC
  Administered 2023-01-01: 25 mg via ORAL
  Filled 2023-01-01: qty 1

## 2023-01-01 NOTE — Progress Notes (Signed)
Diagnosis: Crohn's Disease  Provider:  Chilton Greathouse MD  Procedure: IV Infusion  IV Type: Peripheral, IV Location: R Antecubital  Avsola (infliximab-axxq), Dose: 400 mg  Infusion Start Time: 1029  Infusion Stop Time: 1242  Post Infusion IV Care: Peripheral IV Discontinued  Discharge: Condition: Good, Destination: Home . AVS Provided  Performed by:  Rico Ala, LPN

## 2023-01-20 ENCOUNTER — Ambulatory Visit: Payer: BC Managed Care – PPO | Admitting: Physician Assistant

## 2023-01-20 ENCOUNTER — Encounter: Payer: Self-pay | Admitting: Physician Assistant

## 2023-01-20 VITALS — BP 156/98 | HR 77 | Temp 98.6°F | Ht 63.0 in | Wt 168.1 lb

## 2023-01-20 DIAGNOSIS — H6122 Impacted cerumen, left ear: Secondary | ICD-10-CM

## 2023-01-20 NOTE — Progress Notes (Signed)
      Established patient visit   Patient: Christopher Nguyen   DOB: 1985-02-22   37 y.o. Male  MRN: [id number] Visit Date: 01/20/2023  Today's healthcare provider: Alfredia Ferguson, PA-C   Cc. Ear fullness  Subjective    Pt reports pain , fullness b/l ears, L>R . Feeling like they need to pop.  Denies associated nasal congestion, sore throat.   Medications: Outpatient Medications Prior to Visit  Medication Sig   amLODipine (NORVASC) 5 MG tablet Take 1 tablet (5 mg total) by mouth daily.   FLUoxetine (PROZAC) 20 MG capsule Take 1 capsule (20 mg total) by mouth daily.   Multiple Vitamin (MULTIVITAMIN) tablet Take 1 tablet by mouth daily.   ondansetron (ZOFRAN) 4 MG tablet Take 1 tablet (4 mg total) by mouth every 6 (six) hours.   testosterone cypionate (DEPOTESTOSTERONE CYPIONATE) 200 MG/ML injection Inject 100 mg into the muscle every Sunday.   No facility-administered medications prior to visit.    Review of Systems  Constitutional:  Negative for fatigue and fever.  HENT:  Positive for ear pain.   Respiratory:  Negative for cough and shortness of breath.   Cardiovascular:  Negative for chest pain, palpitations and leg swelling.  Neurological:  Negative for dizziness and headaches.       Objective    BP (!) 156/98   Pulse 77   Temp 98.6 F (37 C) (Oral)   Ht 5\' 3"  (1.6 m)   Wt 168 lb 2 oz (76.3 kg)   SpO2 97%   BMI 29.78 kg/m    Physical Exam Vitals reviewed.  Constitutional:      Appearance: He is not ill-appearing.  HENT:     Head: Normocephalic.     Right Ear: Tympanic membrane normal.     Ears:     Comments: L ear with cerumen impaction. Eyes:     Conjunctiva/sclera: Conjunctivae normal.  Cardiovascular:     Rate and Rhythm: Normal rate.  Pulmonary:     Effort: Pulmonary effort is normal. No respiratory distress.  Neurological:     General: No focal deficit present.     Mental Status: He is alert and oriented to person, place, and time.  Psychiatric:         Mood and Affect: Mood normal.        Behavior: Behavior normal.      No results found for any visits on 01/20/23.  Assessment & Plan    Impacted cerumen of left ear -     Ambulatory referral to ENT   L ear impacted, was irrigated w/ warm water/hydrogen peroxide  Pt tolerated procedure well  On reexamination still a moderate amount of wax. Discussed ref to ent, vs trial of softener at home.   Return if symptoms worsen or fail to improve.       Alfredia Ferguson, PA-C  [facility] [phone] (phone) [phone] (fax)  [facility]

## 2023-01-22 ENCOUNTER — Encounter (INDEPENDENT_AMBULATORY_CARE_PROVIDER_SITE_OTHER): Payer: Self-pay | Admitting: Otolaryngology

## 2023-02-26 ENCOUNTER — Ambulatory Visit (INDEPENDENT_AMBULATORY_CARE_PROVIDER_SITE_OTHER): Payer: BC Managed Care – PPO

## 2023-02-26 VITALS — BP 158/101 | HR 77 | Temp 98.0°F | Resp 16 | Ht 63.0 in | Wt 165.4 lb

## 2023-02-26 DIAGNOSIS — K5090 Crohn's disease, unspecified, without complications: Secondary | ICD-10-CM | POA: Diagnosis not present

## 2023-02-26 MED ORDER — ACETAMINOPHEN 325 MG PO TABS
650.0000 mg | ORAL_TABLET | Freq: Once | ORAL | Status: AC
Start: 2023-02-26 — End: 2023-02-26
  Administered 2023-02-26: 650 mg via ORAL
  Filled 2023-02-26: qty 2

## 2023-02-26 MED ORDER — DIPHENHYDRAMINE HCL 25 MG PO CAPS
25.0000 mg | ORAL_CAPSULE | Freq: Once | ORAL | Status: AC
  Administered 2023-02-26: 25 mg via ORAL
  Filled 2023-02-26: qty 1

## 2023-02-26 MED ORDER — METHYLPREDNISOLONE SODIUM SUCC 40 MG IJ SOLR
40.0000 mg | Freq: Once | INTRAMUSCULAR | Status: AC
Start: 2023-02-26 — End: 2023-02-26
  Administered 2023-02-26: 40 mg via INTRAVENOUS
  Filled 2023-02-26: qty 1

## 2023-02-26 MED ORDER — INFLIXIMAB-AXXQ 100 MG IV SOLR
5.0000 mg/kg | Freq: Once | INTRAVENOUS | Status: AC
  Administered 2023-02-26: 400 mg via INTRAVENOUS
  Filled 2023-02-26: qty 40

## 2023-02-26 NOTE — Progress Notes (Signed)
 Diagnosis: Crohn's Disease  Provider:  Praveen Mannam MD  Procedure: IV Infusion  IV Type: Peripheral, IV Location: R Forearm  Avsola  (infliximab -axxq), Dose: 400 mg  Infusion Start Time: 1023  Infusion Stop Time: 1239  Post Infusion IV Care: Patient declined observation and Peripheral IV Discontinued  Discharge: Condition: Good, Destination: Home . AVS Provided  Performed by:  Baylen Dea, RN

## 2023-03-07 IMAGING — NM NM HEPATO W/GB/PHARM/[PERSON_NAME]
2 series · 12 of 12 positions shown · non-contrast
Comparison: Abdominal ultrasound September 07, 2019

CLINICAL DATA: Right upper quadrant abdominal pain.

EXAM:
NUCLEAR MEDICINE HEPATOBILIARY IMAGING WITH GALLBLADDER EF
TECHNIQUE: Sequential images of the abdomen were obtained [DATE] minutes
following intravenous administration of radiopharmaceutical. After
oral ingestion of Ensure, gallbladder ejection fraction was
determined. At 60 min, normal ejection fraction is greater than 33%.
RADIOPHARMACEUTICALS:  5.5 mCi Oc-MMm  Choletec IV

[Series 1: gbef w ensure · 3.28mm/px · 6 of 60 frames shown]
[frame 6/60]
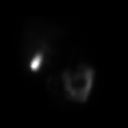
[frame 16/60]
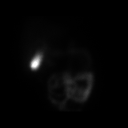
[frame 26/60]
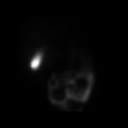
[frame 36/60]
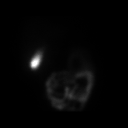
[frame 46/60]
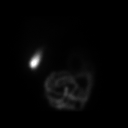
[frame 56/60]
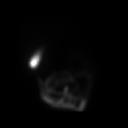

[Series 1: hida scan · 3.28mm/px · 6 of 60 frames shown]
[frame 6/60]
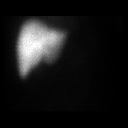
[frame 16/60]
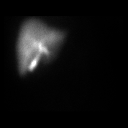
[frame 26/60]
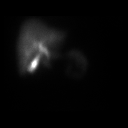
[frame 36/60]
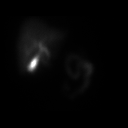
[frame 46/60]
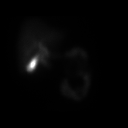
[frame 56/60]
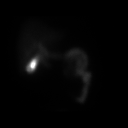

[12 of 12 positions shown; findings below may reference images not displayed]

FINDINGS: Prompt uptake and biliary excretion of activity by the liver is
seen. Gallbladder activity is visualized, consistent with patency of
cystic duct. Biliary activity passes into small bowel, consistent
with patent common bile duct.

Calculated gallbladder ejection fraction is 8%. (Normal gallbladder
ejection fraction with Ensure is greater than 33%.)
IMPRESSION: There is prompt gallbladder filling however the gallbladder ejection
fraction is reduced, findings most consistent with biliary
dyskinesia/chronic cholecystitis.

## 2023-04-22 DIAGNOSIS — E291 Testicular hypofunction: Secondary | ICD-10-CM | POA: Diagnosis not present

## 2023-04-23 ENCOUNTER — Ambulatory Visit (INDEPENDENT_AMBULATORY_CARE_PROVIDER_SITE_OTHER): Payer: BC Managed Care – PPO

## 2023-04-23 VITALS — BP 139/88 | HR 68 | Temp 98.3°F | Resp 16 | Ht 63.0 in | Wt 163.0 lb

## 2023-04-23 DIAGNOSIS — K5090 Crohn's disease, unspecified, without complications: Secondary | ICD-10-CM | POA: Diagnosis not present

## 2023-04-23 MED ORDER — ACETAMINOPHEN 325 MG PO TABS
650.0000 mg | ORAL_TABLET | Freq: Once | ORAL | Status: AC
  Administered 2023-04-23: 650 mg via ORAL
  Filled 2023-04-23: qty 2

## 2023-04-23 MED ORDER — METHYLPREDNISOLONE SODIUM SUCC 40 MG IJ SOLR
40.0000 mg | Freq: Once | INTRAMUSCULAR | Status: AC
Start: 2023-04-23 — End: 2023-04-23
  Administered 2023-04-23: 40 mg via INTRAVENOUS
  Filled 2023-04-23: qty 1

## 2023-04-23 MED ORDER — DIPHENHYDRAMINE HCL 25 MG PO CAPS
25.0000 mg | ORAL_CAPSULE | Freq: Once | ORAL | Status: AC
Start: 2023-04-23 — End: 2023-04-23
  Administered 2023-04-23: 25 mg via ORAL
  Filled 2023-04-23: qty 1

## 2023-04-23 MED ORDER — SODIUM CHLORIDE 0.9 % IV SOLN
5.0000 mg/kg | Freq: Once | INTRAVENOUS | Status: AC
  Administered 2023-04-23: 400 mg via INTRAVENOUS
  Filled 2023-04-23: qty 40

## 2023-04-23 NOTE — Progress Notes (Signed)
 Diagnosis: Crohn's Disease  Provider:  Chilton Greathouse MD  Procedure: IV Infusion  IV Type: Peripheral, IV Location: R Antecubital  Avsola (infliximab-axxq), Dose: 400 mg  Infusion Start Time: 1000  Infusion Stop Time: 1215  Post Infusion IV Care: Observation period completed  Discharge: Condition: Good, Destination: Home . AVS Provided  Performed by:  Marilynn Rail, RN

## 2023-04-28 ENCOUNTER — Telehealth: Payer: Self-pay | Admitting: Pharmacy Technician

## 2023-04-28 NOTE — Telephone Encounter (Signed)
 Auth Submission: APPROVED [place] RE-NEWAL Site of care: Site of care: [facility] Payer: BCBS Medication & CPT/J Code(s) submitted: Avsola (infliximab-axxq) [phone] Route of submission (phone, fax, portal):  Phone # Fax # Auth type: Buy/Bill PB Units/visits requested:5MG /KG 400 MG - 280 UNITS - 7 DOSES Reference number: [id number] Approval from: 04/23/23 to 04/22/24

## 2023-04-29 DIAGNOSIS — E291 Testicular hypofunction: Secondary | ICD-10-CM | POA: Diagnosis not present

## 2023-06-18 ENCOUNTER — Ambulatory Visit (INDEPENDENT_AMBULATORY_CARE_PROVIDER_SITE_OTHER)

## 2023-06-18 VITALS — BP 148/97 | HR 80 | Temp 97.9°F | Resp 14 | Ht 63.0 in | Wt 169.4 lb

## 2023-06-18 DIAGNOSIS — K5090 Crohn's disease, unspecified, without complications: Secondary | ICD-10-CM

## 2023-06-18 MED ORDER — METHYLPREDNISOLONE SODIUM SUCC 40 MG IJ SOLR
40.0000 mg | Freq: Once | INTRAMUSCULAR | Status: AC
  Administered 2023-06-18: 40 mg via INTRAVENOUS
  Filled 2023-06-18: qty 1

## 2023-06-18 MED ORDER — SODIUM CHLORIDE 0.9 % IV SOLN
5.0000 mg/kg | Freq: Once | INTRAVENOUS | Status: AC
  Administered 2023-06-18: 400 mg via INTRAVENOUS
  Filled 2023-06-18: qty 40

## 2023-06-18 MED ORDER — DIPHENHYDRAMINE HCL 25 MG PO CAPS
25.0000 mg | ORAL_CAPSULE | Freq: Once | ORAL | Status: AC
  Administered 2023-06-18: 25 mg via ORAL
  Filled 2023-06-18: qty 1

## 2023-06-18 MED ORDER — ACETAMINOPHEN 325 MG PO TABS
650.0000 mg | ORAL_TABLET | Freq: Once | ORAL | Status: AC
  Administered 2023-06-18: 650 mg via ORAL
  Filled 2023-06-18: qty 2

## 2023-06-18 NOTE — Progress Notes (Signed)
 Diagnosis: Crohn's Disease  Provider:  Praveen Mannam MD  Procedure: IV Infusion  IV Type: Peripheral, IV Location: R Antecubital  Avsola  (infliximab -axxq), Dose: 400 mg  Infusion Start Time: 1004  Infusion Stop Time: 1220  Post Infusion IV Care: Patient declined observation and Peripheral IV Discontinued  Discharge: Condition: Good, Destination: Home . AVS Declined  Performed by:  Lauran Pollard, LPN

## 2023-06-22 ENCOUNTER — Other Ambulatory Visit: Payer: Self-pay | Admitting: Family Medicine

## 2023-06-22 DIAGNOSIS — F411 Generalized anxiety disorder: Secondary | ICD-10-CM

## 2023-06-22 DIAGNOSIS — I10 Essential (primary) hypertension: Secondary | ICD-10-CM

## 2023-07-30 DIAGNOSIS — F4325 Adjustment disorder with mixed disturbance of emotions and conduct: Secondary | ICD-10-CM | POA: Diagnosis not present

## 2023-08-09 ENCOUNTER — Encounter: Payer: Self-pay | Admitting: Family Medicine

## 2023-08-13 ENCOUNTER — Ambulatory Visit

## 2023-08-13 DIAGNOSIS — F4325 Adjustment disorder with mixed disturbance of emotions and conduct: Secondary | ICD-10-CM | POA: Diagnosis not present

## 2023-08-17 ENCOUNTER — Ambulatory Visit

## 2023-08-17 VITALS — BP 159/100 | HR 85 | Temp 98.4°F | Resp 16 | Ht 63.0 in | Wt 166.4 lb

## 2023-08-17 DIAGNOSIS — K5090 Crohn's disease, unspecified, without complications: Secondary | ICD-10-CM | POA: Diagnosis not present

## 2023-08-17 MED ORDER — METHYLPREDNISOLONE SODIUM SUCC 40 MG IJ SOLR
40.0000 mg | Freq: Once | INTRAMUSCULAR | Status: AC
  Administered 2023-08-17: 40 mg via INTRAVENOUS
  Filled 2023-08-17: qty 1

## 2023-08-17 MED ORDER — SODIUM CHLORIDE 0.9 % IV SOLN
5.0000 mg/kg | Freq: Once | INTRAVENOUS | Status: AC
  Administered 2023-08-17: 400 mg via INTRAVENOUS
  Filled 2023-08-17: qty 40

## 2023-08-17 MED ORDER — DIPHENHYDRAMINE HCL 25 MG PO CAPS
25.0000 mg | ORAL_CAPSULE | Freq: Once | ORAL | Status: AC
  Administered 2023-08-17: 25 mg via ORAL
  Filled 2023-08-17: qty 1

## 2023-08-17 MED ORDER — ACETAMINOPHEN 325 MG PO TABS
650.0000 mg | ORAL_TABLET | Freq: Once | ORAL | Status: AC
  Administered 2023-08-17: 650 mg via ORAL
  Filled 2023-08-17: qty 2

## 2023-08-17 NOTE — Progress Notes (Signed)
 Diagnosis: Crohn's Disease  Provider:  Praveen Mannam MD  Procedure: IV Infusion  IV Type: Peripheral, IV Location: R Antecubital  Avsola  (infliximab -axxq), Dose: 400 mg  Infusion Start Time: 1148  Infusion Stop Time: 1404  Post Infusion IV Care: Peripheral IV Discontinued  Discharge: Condition: Good, Destination: Home . AVS Declined  Performed by:  Leita FORBES Miles, LPN

## 2023-08-20 NOTE — Progress Notes (Unsigned)
 [facility] [address] [place], [place] [address] [phone]  Date:  08/23/2023   Name:  Christopher Nguyen   DOB:  March 30, 1985   MRN:  [id number]  PCP:  Christopher Harlene BROCKS, MD    Chief Complaint: No chief complaint on file.   History of Present Illness:  Christopher Nguyen is a 38 y.o. very pleasant male patient who presents with the following:  Patient seen today for physical exam.  Most recent visit with myself was just over a year ago - History of hypertension, Crohn's disease, hypogonadism, overweight  He recently reached out to us  asking for STI screening  His father has history of colon cancer His gastroenterologist is Dr. 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 Carolynn  Most recent colonoscopy and upper endoscopy February 2023 Labs just over 1 year ago Patient Active Problem List   Diagnosis Date Noted   Crohn's disease (HCC) 11/09/2020   Overweight 09/18/2015   Essential hypertension, benign 09/18/2015    Past Medical History:  Diagnosis Date   Azoospermia due to obstruction    Crohn disease (HCC)    Hypertension    Low testosterone      Past Surgical History:  Procedure Laterality Date   BIOPSY  10/15/2021   Procedure: BIOPSY;  Surgeon: Wilhelmenia Aloha Raddle., MD;  Location: [facility];  Service: Gastroenterology;;   CLUB FOOT RELEASE  infant   COLONOSCOPY     ESOPHAGOGASTRODUODENOSCOPY N/A 10/15/2021   Procedure: ESOPHAGOGASTRODUODENOSCOPY (EGD);  Surgeon: Wilhelmenia Aloha Raddle., MD;  Location: [facility];  Service: Gastroenterology;  Laterality: N/A;   EUS N/A 10/15/2021   Procedure: UPPER ENDOSCOPIC ULTRASOUND (EUS) RADIAL;  Surgeon: Wilhelmenia Aloha Raddle., MD;  Location: [facility];  Service: Gastroenterology;  Laterality: N/A;   TESTICLE BIOPSY  Bilateral 01/28/2015   Procedure: BIOPSY TESTICULAR;  Surgeon: Arlena Gal, MD;  Location: [facility];  Service: Urology;  Laterality: Bilateral;   UPPER GASTROINTESTINAL ENDOSCOPY      Social History   Tobacco Use   Smoking status: Never   Smokeless tobacco: Never  Vaping Use   Vaping status: Never Used  Substance Use Topics   Alcohol use: Yes    Comment: weekly   Drug use: No    Family History  Problem Relation Age of Onset   Bladder Cancer Mother    Colon cancer Father    Hypertension Father    Hypertension Brother    Melanoma Brother    Alcoholism Maternal Grandmother    Alcohol abuse Maternal Grandfather    Alcoholism Maternal Grandfather    Esophageal cancer Neg Hx    Rectal cancer Neg Hx    Stomach cancer Neg Hx     Allergies  Allergen Reactions   Bentyl  [Dicyclomine ] Swelling   Cephalosporins Other (See Comments)    Unknown childhood reaction    Medication list has been reviewed and updated.  Current Outpatient Medications on File Prior to Visit  Medication Sig Dispense Refill   amLODipine  (NORVASC ) 5 MG tablet Take 1 tablet (5 mg total) by mouth daily. 90 tablet 0   FLUoxetine  (PROZAC ) 20 MG capsule Take 1 capsule (20 mg total) by mouth daily. 90 capsule 0   Multiple Vitamin (MULTIVITAMIN) tablet Take 1 tablet by mouth daily.     ondansetron  (ZOFRAN )  4 MG tablet Take 1 tablet (4 mg total) by mouth every 6 (six) hours. 15 tablet 0   testosterone  cypionate (DEPOTESTOSTERONE CYPIONATE) 200 MG/ML injection Inject 100 mg into the muscle every Sunday.     No current facility-administered medications on file prior to visit.    Review of Systems:  As per HPI- otherwise negative.   Physical Examination: There were no vitals filed for this visit. There were no vitals filed for this visit. There is no height or weight on file to calculate BMI. Ideal Body Weight:    GEN: no acute distress. HEENT: Atraumatic, Normocephalic.  Ears  and Nose: No external deformity. CV: RRR, No M/G/R. No JVD. No thrill. No extra heart sounds. PULM: CTA B, no wheezes, crackles, rhonchi. No retractions. No resp. distress. No accessory muscle use. ABD: S, NT, ND, +BS. No rebound. No HSM. EXTR: No c/c/e PSYCH: Normally interactive. Conversant.   Assessment and Plan: *** Patient seen today for physical exam.  Encouraged healthy diet and exercise routine Will plan further follow- up pending labs.  Signed Harlene Schroeder, MD

## 2023-08-23 ENCOUNTER — Ambulatory Visit (INDEPENDENT_AMBULATORY_CARE_PROVIDER_SITE_OTHER): Admitting: Family Medicine

## 2023-08-23 ENCOUNTER — Encounter: Payer: Self-pay | Admitting: Family Medicine

## 2023-08-23 VITALS — BP 150/100 | HR 66 | Ht 63.0 in | Wt 165.8 lb

## 2023-08-23 DIAGNOSIS — Z0000 Encounter for general adult medical examination without abnormal findings: Secondary | ICD-10-CM | POA: Diagnosis not present

## 2023-08-23 DIAGNOSIS — F411 Generalized anxiety disorder: Secondary | ICD-10-CM

## 2023-08-23 DIAGNOSIS — I10 Essential (primary) hypertension: Secondary | ICD-10-CM

## 2023-08-23 DIAGNOSIS — Z13220 Encounter for screening for lipoid disorders: Secondary | ICD-10-CM

## 2023-08-23 DIAGNOSIS — K5090 Crohn's disease, unspecified, without complications: Secondary | ICD-10-CM | POA: Diagnosis not present

## 2023-08-23 DIAGNOSIS — Z131 Encounter for screening for diabetes mellitus: Secondary | ICD-10-CM

## 2023-08-23 DIAGNOSIS — A64 Unspecified sexually transmitted disease: Secondary | ICD-10-CM

## 2023-08-23 LAB — COMPREHENSIVE METABOLIC PANEL WITH GFR
ALT: 18 U/L (ref 0–53)
AST: 15 U/L (ref 0–37)
Albumin: 4.7 g/dL (ref 3.5–5.2)
Alkaline Phosphatase: 47 U/L (ref 39–117)
BUN: 18 mg/dL (ref 6–23)
CO2: 27 meq/L (ref 19–32)
Calcium: 9.7 mg/dL (ref 8.4–10.5)
Chloride: 101 meq/L (ref 96–112)
Creatinine, Ser: 1.05 mg/dL (ref 0.40–1.50)
GFR: 90.09 mL/min (ref >60.00)
Glucose, Bld: 89 mg/dL (ref 70–99)
Potassium: 4.6 meq/L (ref 3.5–5.1)
Sodium: 137 meq/L (ref 135–145)
Total Bilirubin: 0.6 mg/dL (ref 0.2–1.2)
Total Protein: 7.7 g/dL (ref 6.0–8.3)

## 2023-08-23 LAB — CBC
HCT: 46.3 % (ref 39.0–52.0)
Hemoglobin: 15.8 g/dL (ref 13.0–17.0)
MCHC: 34.1 g/dL (ref 30.0–36.0)
MCV: 86 fl (ref 78.0–100.0)
Platelets: 313 K/uL (ref 150.0–400.0)
RBC: 5.39 Mil/uL (ref 4.22–5.81)
RDW: 12.7 % (ref 11.5–15.5)
WBC: 6.6 K/uL (ref 4.0–10.5)

## 2023-08-23 LAB — LIPID PANEL
Cholesterol: 218 mg/dL — ABNORMAL HIGH (ref 0–200)
HDL: 48.1 mg/dL (ref >39.00)
LDL Cholesterol: 115 mg/dL — ABNORMAL HIGH (ref 0–99)
NonHDL: 169.76
Total CHOL/HDL Ratio: 5
Triglycerides: 275 mg/dL — ABNORMAL HIGH (ref 0.0–149.0)
VLDL: 55 mg/dL — ABNORMAL HIGH (ref 0.0–40.0)

## 2023-08-23 LAB — HEMOGLOBIN A1C: Hgb A1c MFr Bld: 5.2 % (ref 4.6–6.5)

## 2023-08-23 MED ORDER — FLUOXETINE HCL 20 MG PO CAPS: 20.0000 mg | ORAL_CAPSULE | Freq: Every day | ORAL | 3 refills | Status: AC

## 2023-08-23 MED ORDER — LOSARTAN POTASSIUM 50 MG PO TABS
50.0000 mg | ORAL_TABLET | Freq: Every day | ORAL | 3 refills | Status: AC
Start: 2023-08-23 — End: ?

## 2023-08-23 NOTE — Patient Instructions (Signed)
 Good to see you today- I will be in touch with your labs asap Keep up the good work with diet and exercise Let's try changing over to losartan  50 for your BP- let me know how this works for you.  If not at goal we can add back amlodipine  or increase losartan 

## 2023-08-24 ENCOUNTER — Ambulatory Visit: Payer: Self-pay | Admitting: Family Medicine

## 2023-08-24 LAB — RPR: RPR Ser Ql: NONREACTIVE

## 2023-08-24 LAB — HEPATITIS B SURFACE ANTIGEN: Hepatitis B Surface Ag: NONREACTIVE

## 2023-08-24 LAB — HIV ANTIBODY (ROUTINE TESTING W REFLEX): HIV 1&2 Ab, 4th Generation: NONREACTIVE

## 2023-08-24 LAB — HEPATITIS C ANTIBODY: Hepatitis C Ab: NONREACTIVE

## 2023-08-27 DIAGNOSIS — F4325 Adjustment disorder with mixed disturbance of emotions and conduct: Secondary | ICD-10-CM | POA: Diagnosis not present

## 2023-09-10 DIAGNOSIS — F4325 Adjustment disorder with mixed disturbance of emotions and conduct: Secondary | ICD-10-CM | POA: Diagnosis not present

## 2023-09-16 ENCOUNTER — Telehealth: Payer: Self-pay | Admitting: Gastroenterology

## 2023-09-16 DIAGNOSIS — K5090 Crohn's disease, unspecified, without complications: Secondary | ICD-10-CM

## 2023-09-16 NOTE — Telephone Encounter (Signed)
 Patient called stating he thinks he's having a flare and wanted you to call in something for him to his pharmacy.  Please call and advise.  Thank you.

## 2023-09-20 MED ORDER — PREDNISONE 10 MG PO TABS: ORAL_TABLET | ORAL | 0 refills | Status: AC

## 2023-09-20 NOTE — Telephone Encounter (Signed)
 This message was sent to me while out of the office. I called patient this morning and apologized for not getting back to him sooner. Patient states that he has been having symptoms of a possible flare for several weeks. Patient states that he feels like his intestines are narrowed, almost like a stricture. Patient reports having painful bowel movements, and not passing much stool. Lats BM was about 10 minutes ago and he passed very little stool. Denies any blood or mucous in the stool, denies fever. Patient does report generalized abdominal pain. Patient continues to have signs of constipation despite OTC laxatives. Last Avsola  infusion was 08/17/23, next infusion is 10/14/23. Please advise, thanks.

## 2023-09-20 NOTE — Telephone Encounter (Signed)
 Lab and stool study orders in epic.  Prednisone  prescription sent to pharmacy on file. Next Avsola  infusion on 10/14/23, patient will need infliximab  trough and antibody level drawn on 10/07/23. Reminder in epic. MyChart message sent to patient with recommendations.

## 2023-10-06 ENCOUNTER — Other Ambulatory Visit (INDEPENDENT_AMBULATORY_CARE_PROVIDER_SITE_OTHER)

## 2023-10-06 ENCOUNTER — Telehealth: Payer: Self-pay

## 2023-10-06 ENCOUNTER — Other Ambulatory Visit

## 2023-10-06 DIAGNOSIS — K5090 Crohn's disease, unspecified, without complications: Secondary | ICD-10-CM

## 2023-10-06 DIAGNOSIS — Z9225 Personal history of immunosupression therapy: Secondary | ICD-10-CM | POA: Diagnosis not present

## 2023-10-06 DIAGNOSIS — K5010 Crohn's disease of large intestine without complications: Secondary | ICD-10-CM | POA: Diagnosis not present

## 2023-10-06 LAB — BASIC METABOLIC PANEL WITH GFR
BUN: 24 mg/dL — ABNORMAL HIGH (ref 6–23)
CO2: 28 meq/L (ref 19–32)
Calcium: 9 mg/dL (ref 8.4–10.5)
Chloride: 102 meq/L (ref 96–112)
Creatinine, Ser: 1.05 mg/dL (ref 0.40–1.50)
GFR: 90.02 mL/min (ref >60.00)
Glucose, Bld: 81 mg/dL (ref 70–99)
Potassium: 4.2 meq/L (ref 3.5–5.1)
Sodium: 138 meq/L (ref 135–145)

## 2023-10-06 LAB — CBC WITH DIFFERENTIAL/PLATELET
Basophils Absolute: 0.1 K/uL (ref 0.0–0.1)
Basophils Relative: 1.3 % (ref 0.0–3.0)
Eosinophils Absolute: 0 K/uL (ref 0.0–0.7)
Eosinophils Relative: 0.3 % (ref 0.0–5.0)
HCT: 44.6 % (ref 39.0–52.0)
Hemoglobin: 14.9 g/dL (ref 13.0–17.0)
Lymphocytes Relative: 27.5 % (ref 12.0–46.0)
Lymphs Abs: 2.4 K/uL (ref 0.7–4.0)
MCHC: 33.5 g/dL (ref 30.0–36.0)
MCV: 85.6 fl (ref 78.0–100.0)
Monocytes Absolute: 0.4 K/uL (ref 0.1–1.0)
Monocytes Relative: 5 % (ref 3.0–12.0)
Neutro Abs: 5.8 K/uL (ref 1.4–7.7)
Neutrophils Relative %: 65.9 % (ref 43.0–77.0)
Platelets: 297 K/uL (ref 150.0–400.0)
RBC: 5.2 Mil/uL (ref 4.22–5.81)
RDW: 13.6 % (ref 11.5–15.5)
WBC: 8.8 K/uL (ref 4.0–10.5)

## 2023-10-06 LAB — C-REACTIVE PROTEIN: CRP: <1 mg/dL (ref 0.5–20.0)

## 2023-10-06 LAB — SEDIMENTATION RATE: Sed Rate: 5 mm/h (ref 0–15)

## 2023-10-06 NOTE — Telephone Encounter (Signed)
-----   Message from Nurse Leola B sent at 09/20/2023  4:43 PM EDT ----- Regarding: infliximab  trough and antibody level infliximab  trough and antibody level - need to order, needs to be drawn on 8/21 Dr. San

## 2023-10-06 NOTE — Telephone Encounter (Signed)
 Lab order in epic. MyChart message to patient with reminder.

## 2023-10-07 ENCOUNTER — Ambulatory Visit: Payer: Self-pay | Admitting: Gastroenterology

## 2023-10-08 DIAGNOSIS — F4325 Adjustment disorder with mixed disturbance of emotions and conduct: Secondary | ICD-10-CM | POA: Diagnosis not present

## 2023-10-09 LAB — CALPROTECTIN, FECAL: Calprotectin, Fecal: 18 ug/g (ref 0–120)

## 2023-10-14 ENCOUNTER — Ambulatory Visit

## 2023-10-14 VITALS — BP 171/103 | HR 64 | Temp 98.1°F | Resp 16 | Ht 63.0 in | Wt 168.6 lb

## 2023-10-14 DIAGNOSIS — K5090 Crohn's disease, unspecified, without complications: Secondary | ICD-10-CM

## 2023-10-14 MED ORDER — ACETAMINOPHEN 325 MG PO TABS
650.0000 mg | ORAL_TABLET | Freq: Once | ORAL | Status: AC
Start: 2023-10-14 — End: 2023-10-14
  Administered 2023-10-14: 650 mg via ORAL
  Filled 2023-10-14: qty 2

## 2023-10-14 MED ORDER — METHYLPREDNISOLONE SODIUM SUCC 40 MG IJ SOLR
40.0000 mg | Freq: Once | INTRAMUSCULAR | Status: AC
  Administered 2023-10-14: 40 mg via INTRAVENOUS
  Filled 2023-10-14: qty 1

## 2023-10-14 MED ORDER — DIPHENHYDRAMINE HCL 25 MG PO CAPS
25.0000 mg | ORAL_CAPSULE | Freq: Once | ORAL | Status: AC
  Administered 2023-10-14: 25 mg via ORAL
  Filled 2023-10-14: qty 1

## 2023-10-14 MED ORDER — SODIUM CHLORIDE 0.9 % IV SOLN
5.0000 mg/kg | Freq: Once | INTRAVENOUS | Status: AC
  Administered 2023-10-14: 400 mg via INTRAVENOUS
  Filled 2023-10-14: qty 40

## 2023-10-14 NOTE — Progress Notes (Signed)
 Diagnosis: Crohn's Disease  Provider:  Praveen Mannam MD  Procedure: IV Infusion  IV Type: Peripheral, IV Location: R Antecubital  Avsola  (infliximab -axxq), Dose: 400 mg  Infusion Start Time: 0911  Infusion Stop Time: 1127  Post Infusion IV Care: Peripheral IV Discontinued  Discharge: Condition: Good, Destination: Home . AVS Declined  Performed by:  Leita FORBES Miles, LPN

## 2023-10-15 LAB — INFLIXIMAB+AB (SERIAL MONITOR)
Anti-Infliximab Antibody: <22 ng/mL
Infliximab Drug Level: 3.6 ug/mL

## 2023-10-15 LAB — SERIAL MONITORING

## 2023-10-20 ENCOUNTER — Telehealth: Payer: Self-pay | Admitting: Pharmacy Technician

## 2023-10-20 NOTE — Telephone Encounter (Signed)
 Luke, can we obtain auth for Avsola  5 mg/kg every 6 weeks? Next 8 week infusion is scheduled for 10/23, hoping to have approval before then so appointment can be moved up. Thank you

## 2023-10-20 NOTE — Telephone Encounter (Signed)
 Dr. San / [place],  Increase in frequency has been approved. Avsola  6wks dosing.  Patient will be scheduled 6wks from his last appt. Treatment plan will be updated.  Auth Submission: APPROVED Site of care: Site of care: [facility] Payer: BCBS Medication & CPT/J Code(s) submitted: Avsola  (infliximab -axxq) [id number] Diagnosis Code:  Route of submission (phone, fax, portal): FAXED Phone # Fax # Auth type: Buy/Bill PB Units/visits requested: 5MG /KG Q6WKS Reference number: [id number] Approval from: 9.3.25 to 9.3.26

## 2023-10-21 ENCOUNTER — Other Ambulatory Visit: Payer: Self-pay | Admitting: Gastroenterology

## 2023-11-25 ENCOUNTER — Ambulatory Visit (INDEPENDENT_AMBULATORY_CARE_PROVIDER_SITE_OTHER)

## 2023-11-25 VITALS — BP 152/95 | HR 77 | Temp 98.0°F | Resp 14 | Ht 63.0 in | Wt 172.6 lb

## 2023-11-25 DIAGNOSIS — K5090 Crohn's disease, unspecified, without complications: Secondary | ICD-10-CM

## 2023-11-25 MED ORDER — ACETAMINOPHEN 325 MG PO TABS
650.0000 mg | ORAL_TABLET | Freq: Once | ORAL | Status: AC
  Administered 2023-11-25: 650 mg via ORAL
  Filled 2023-11-25: qty 2

## 2023-11-25 MED ORDER — DIPHENHYDRAMINE HCL 25 MG PO CAPS
25.0000 mg | ORAL_CAPSULE | Freq: Once | ORAL | Status: AC
  Administered 2023-11-25: 25 mg via ORAL
  Filled 2023-11-25: qty 1

## 2023-11-25 MED ORDER — SODIUM CHLORIDE 0.9 % IV SOLN
5.0000 mg/kg | Freq: Once | INTRAVENOUS | Status: AC
  Administered 2023-11-25: 400 mg via INTRAVENOUS
  Filled 2023-11-25: qty 40

## 2023-11-25 MED ORDER — METHYLPREDNISOLONE SODIUM SUCC 40 MG IJ SOLR
40.0000 mg | Freq: Once | INTRAMUSCULAR | Status: AC
  Administered 2023-11-25: 40 mg via INTRAVENOUS
  Filled 2023-11-25: qty 1

## 2023-11-25 NOTE — Progress Notes (Signed)
 Diagnosis: Crohn's Disease  Provider:  Mannam, Praveen MD  Procedure: IV Infusion  IV Type: Peripheral, IV Location: R Antecubital  Avsola  (infliximab -axxq), Dose: 400 mg  Infusion Start Time: 0902  Infusion Stop Time: 1113  Post Infusion IV Care: Peripheral IV Discontinued  Discharge: Condition: Good, Destination: Home . AVS Declined  Performed by:  Rocky FORBES Sar, RN

## 2023-12-09 ENCOUNTER — Ambulatory Visit

## 2024-01-06 ENCOUNTER — Ambulatory Visit (INDEPENDENT_AMBULATORY_CARE_PROVIDER_SITE_OTHER)

## 2024-01-06 VITALS — BP 128/77 | HR 69 | Temp 98.1°F | Resp 18 | Ht 63.0 in | Wt 170.4 lb

## 2024-01-06 DIAGNOSIS — K5090 Crohn's disease, unspecified, without complications: Secondary | ICD-10-CM

## 2024-01-06 MED ORDER — DIPHENHYDRAMINE HCL 25 MG PO CAPS
25.0000 mg | ORAL_CAPSULE | Freq: Once | ORAL | Status: AC
  Administered 2024-01-06: 25 mg via ORAL
  Filled 2024-01-06: qty 1

## 2024-01-06 MED ORDER — ACETAMINOPHEN 325 MG PO TABS
650.0000 mg | ORAL_TABLET | Freq: Once | ORAL | Status: AC
  Administered 2024-01-06: 650 mg via ORAL
  Filled 2024-01-06: qty 2

## 2024-01-06 MED ORDER — SODIUM CHLORIDE 0.9 % IV SOLN
5.0000 mg/kg | Freq: Once | INTRAVENOUS | Status: AC
  Administered 2024-01-06: 400 mg via INTRAVENOUS
  Filled 2024-01-06 (×3): qty 40

## 2024-01-06 MED ORDER — METHYLPREDNISOLONE SODIUM SUCC 40 MG IJ SOLR
40.0000 mg | Freq: Once | INTRAMUSCULAR | Status: AC
  Administered 2024-01-06: 40 mg via INTRAVENOUS
  Filled 2024-01-06: qty 1

## 2024-01-06 NOTE — Progress Notes (Signed)
 Diagnosis: Crohn's Disease  Provider:  Praveen Mannam MD  Procedure: IV Infusion  IV Type: Peripheral, IV Location: R Antecubital  Avsola  (infliximab -axxq), Dose: 400 mg  Infusion Start Time: 0854  Infusion Stop Time: 1100  Post Infusion IV Care: Peripheral IV Discontinued  Discharge: Condition: Good, Destination: Home . AVS Declined  Performed by:  Eevie Lapp, RN

## 2024-02-18 ENCOUNTER — Ambulatory Visit (INDEPENDENT_AMBULATORY_CARE_PROVIDER_SITE_OTHER)

## 2024-02-18 VITALS — BP 138/87 | HR 77 | Temp 98.0°F | Resp 16 | Ht 63.0 in | Wt 171.6 lb

## 2024-02-18 DIAGNOSIS — K5090 Crohn's disease, unspecified, without complications: Secondary | ICD-10-CM | POA: Diagnosis not present

## 2024-02-18 MED ORDER — DIPHENHYDRAMINE HCL 25 MG PO CAPS
25.0000 mg | ORAL_CAPSULE | Freq: Once | ORAL | Status: AC
  Administered 2024-02-18: 25 mg via ORAL
  Filled 2024-02-18: qty 1

## 2024-02-18 MED ORDER — METHYLPREDNISOLONE SODIUM SUCC 40 MG IJ SOLR
40.0000 mg | Freq: Once | INTRAMUSCULAR | Status: AC
  Administered 2024-02-18: 40 mg via INTRAVENOUS
  Filled 2024-02-18: qty 1

## 2024-02-18 MED ORDER — ACETAMINOPHEN 325 MG PO TABS
650.0000 mg | ORAL_TABLET | Freq: Once | ORAL | Status: AC
  Administered 2024-02-18: 650 mg via ORAL
  Filled 2024-02-18: qty 2

## 2024-02-18 MED ORDER — SODIUM CHLORIDE 0.9 % IV SOLN
5.0000 mg/kg | Freq: Once | INTRAVENOUS | Status: AC
  Administered 2024-02-18: 400 mg via INTRAVENOUS
  Filled 2024-02-18: qty 40

## 2024-02-18 NOTE — Progress Notes (Signed)
 Diagnosis:  Crohn's Disease  Provider:  Praveen Mannam MD  Procedure: IV Infusion  IV Type: Peripheral, IV Location: R Antecubital   Avsola  (infliximab -axxq), Dose: 400 mg  Infusion Start Time: 0931  Infusion Stop Time: 1145  Post Infusion IV Care: Peripheral IV Discontinued  Discharge: Condition: Good, Destination: Home . AVS Declined  Performed by:  Talea Manges, RN

## 2024-03-31 ENCOUNTER — Ambulatory Visit
